# Patient Record
Sex: Male | Born: 1940 | Race: White | Hispanic: Yes | Marital: Married | Smoking: Former smoker
Health system: Southern US, Community
[De-identification: ages and names within clinical notes are randomized; demographics above are authoritative.]

## PROBLEM LIST (undated history)

## (undated) DIAGNOSIS — G4733 Obstructive sleep apnea (adult) (pediatric): Secondary | ICD-10-CM

## (undated) DIAGNOSIS — T300 Burn of unspecified body region, unspecified degree: Secondary | ICD-10-CM

## (undated) DIAGNOSIS — R7611 Nonspecific reaction to tuberculin skin test without active tuberculosis: Secondary | ICD-10-CM

## (undated) DIAGNOSIS — Z942 Lung transplant status: Secondary | ICD-10-CM

## (undated) DIAGNOSIS — I10 Essential (primary) hypertension: Secondary | ICD-10-CM

## (undated) DIAGNOSIS — J84112 Idiopathic pulmonary fibrosis: Secondary | ICD-10-CM

## (undated) HISTORY — DX: Burn of unspecified body region, unspecified degree: T30.0

## (undated) HISTORY — PX: LUNG BIOPSY: SHX232

## (undated) HISTORY — PX: LUNG TRANSPLANT: SHX234

## (undated) HISTORY — DX: Idiopathic pulmonary fibrosis: J84.112

## (undated) HISTORY — DX: Nonspecific reaction to tuberculin skin test without active tuberculosis: R76.11

## (undated) HISTORY — DX: Obstructive sleep apnea (adult) (pediatric): G47.33

---

## 1999-05-27 ENCOUNTER — Encounter: Admission: RE | Admit: 1999-05-27 | Discharge: 1999-05-27 | Payer: Self-pay | Admitting: Internal Medicine

## 1999-05-27 ENCOUNTER — Encounter: Payer: Self-pay | Admitting: Internal Medicine

## 1999-08-02 ENCOUNTER — Ambulatory Visit (HOSPITAL_COMMUNITY): Admission: RE | Admit: 1999-08-02 | Discharge: 1999-08-02 | Payer: Self-pay | Admitting: Orthopedic Surgery

## 1999-08-02 ENCOUNTER — Encounter: Payer: Self-pay | Admitting: Orthopedic Surgery

## 1999-09-10 ENCOUNTER — Encounter: Payer: Self-pay | Admitting: Orthopedic Surgery

## 1999-09-10 ENCOUNTER — Ambulatory Visit (HOSPITAL_COMMUNITY): Admission: RE | Admit: 1999-09-10 | Discharge: 1999-09-10 | Payer: Self-pay | Admitting: Orthopedic Surgery

## 1999-11-22 ENCOUNTER — Ambulatory Visit (HOSPITAL_COMMUNITY): Admission: RE | Admit: 1999-11-22 | Discharge: 1999-11-22 | Payer: Self-pay | Admitting: Orthopedic Surgery

## 1999-11-22 ENCOUNTER — Encounter: Payer: Self-pay | Admitting: Orthopedic Surgery

## 2006-04-03 ENCOUNTER — Encounter: Admission: RE | Admit: 2006-04-03 | Discharge: 2006-04-03 | Payer: Self-pay | Admitting: Internal Medicine

## 2007-10-21 ENCOUNTER — Encounter: Payer: Self-pay | Admitting: Internal Medicine

## 2007-10-29 ENCOUNTER — Encounter: Admission: RE | Admit: 2007-10-29 | Discharge: 2007-10-29 | Payer: Self-pay | Admitting: Internal Medicine

## 2007-12-17 ENCOUNTER — Ambulatory Visit: Payer: Self-pay | Admitting: Internal Medicine

## 2007-12-17 DIAGNOSIS — E119 Type 2 diabetes mellitus without complications: Secondary | ICD-10-CM

## 2007-12-17 DIAGNOSIS — I10 Essential (primary) hypertension: Secondary | ICD-10-CM | POA: Insufficient documentation

## 2007-12-17 DIAGNOSIS — J8410 Pulmonary fibrosis, unspecified: Secondary | ICD-10-CM | POA: Insufficient documentation

## 2007-12-17 DIAGNOSIS — G4730 Sleep apnea, unspecified: Secondary | ICD-10-CM | POA: Insufficient documentation

## 2007-12-24 ENCOUNTER — Telehealth: Payer: Self-pay | Admitting: Internal Medicine

## 2007-12-24 LAB — CONVERTED CEMR LAB: Prothrombin Time: 12.3 s (ref 10.9–13.3)

## 2008-01-25 ENCOUNTER — Telehealth (INDEPENDENT_AMBULATORY_CARE_PROVIDER_SITE_OTHER): Payer: Self-pay | Admitting: *Deleted

## 2008-02-09 ENCOUNTER — Ambulatory Visit: Admission: RE | Admit: 2008-02-09 | Discharge: 2008-02-09 | Payer: Self-pay | Admitting: Internal Medicine

## 2008-02-09 ENCOUNTER — Encounter: Payer: Self-pay | Admitting: Internal Medicine

## 2008-02-09 ENCOUNTER — Ambulatory Visit: Payer: Self-pay | Admitting: Internal Medicine

## 2008-02-24 ENCOUNTER — Encounter: Payer: Self-pay | Admitting: Internal Medicine

## 2008-03-10 DIAGNOSIS — G4733 Obstructive sleep apnea (adult) (pediatric): Secondary | ICD-10-CM

## 2008-03-10 HISTORY — DX: Obstructive sleep apnea (adult) (pediatric): G47.33

## 2008-03-17 ENCOUNTER — Ambulatory Visit: Payer: Self-pay | Admitting: Internal Medicine

## 2008-03-17 DIAGNOSIS — R05 Cough: Secondary | ICD-10-CM | POA: Insufficient documentation

## 2008-03-17 DIAGNOSIS — R0902 Hypoxemia: Secondary | ICD-10-CM | POA: Insufficient documentation

## 2008-03-17 DIAGNOSIS — J82 Pulmonary eosinophilia, not elsewhere classified: Secondary | ICD-10-CM

## 2008-03-17 DIAGNOSIS — J479 Bronchiectasis, uncomplicated: Secondary | ICD-10-CM

## 2008-03-17 DIAGNOSIS — R0982 Postnasal drip: Secondary | ICD-10-CM | POA: Insufficient documentation

## 2008-03-20 ENCOUNTER — Ambulatory Visit: Payer: Self-pay | Admitting: Internal Medicine

## 2008-03-22 ENCOUNTER — Encounter: Payer: Self-pay | Admitting: Internal Medicine

## 2008-03-24 ENCOUNTER — Encounter: Payer: Self-pay | Admitting: Internal Medicine

## 2008-03-29 ENCOUNTER — Ambulatory Visit: Payer: Self-pay | Admitting: Internal Medicine

## 2008-04-04 ENCOUNTER — Telehealth: Payer: Self-pay | Admitting: Internal Medicine

## 2008-04-11 ENCOUNTER — Ambulatory Visit: Payer: Self-pay | Admitting: Thoracic Surgery (Cardiothoracic Vascular Surgery)

## 2008-06-16 ENCOUNTER — Encounter
Admission: RE | Admit: 2008-06-16 | Discharge: 2008-06-16 | Payer: Self-pay | Admitting: Thoracic Surgery (Cardiothoracic Vascular Surgery)

## 2008-06-16 ENCOUNTER — Ambulatory Visit: Payer: Self-pay | Admitting: Thoracic Surgery (Cardiothoracic Vascular Surgery)

## 2008-06-21 ENCOUNTER — Inpatient Hospital Stay (HOSPITAL_COMMUNITY)
Admission: RE | Admit: 2008-06-21 | Discharge: 2008-06-26 | Payer: Self-pay | Admitting: Thoracic Surgery (Cardiothoracic Vascular Surgery)

## 2008-06-21 ENCOUNTER — Encounter: Payer: Self-pay | Admitting: Thoracic Surgery (Cardiothoracic Vascular Surgery)

## 2008-06-21 ENCOUNTER — Ambulatory Visit: Payer: Self-pay | Admitting: Thoracic Surgery (Cardiothoracic Vascular Surgery)

## 2008-06-30 ENCOUNTER — Ambulatory Visit: Payer: Self-pay | Admitting: Thoracic Surgery (Cardiothoracic Vascular Surgery)

## 2008-07-03 ENCOUNTER — Ambulatory Visit: Payer: Self-pay | Admitting: Thoracic Surgery (Cardiothoracic Vascular Surgery)

## 2008-07-03 ENCOUNTER — Encounter
Admission: RE | Admit: 2008-07-03 | Discharge: 2008-07-03 | Payer: Self-pay | Admitting: Thoracic Surgery (Cardiothoracic Vascular Surgery)

## 2008-07-10 ENCOUNTER — Ambulatory Visit: Payer: Self-pay | Admitting: Internal Medicine

## 2008-07-10 DIAGNOSIS — L02219 Cutaneous abscess of trunk, unspecified: Secondary | ICD-10-CM | POA: Insufficient documentation

## 2008-07-10 DIAGNOSIS — E669 Obesity, unspecified: Secondary | ICD-10-CM | POA: Insufficient documentation

## 2008-07-10 DIAGNOSIS — L03319 Cellulitis of trunk, unspecified: Secondary | ICD-10-CM

## 2008-07-18 ENCOUNTER — Telehealth (INDEPENDENT_AMBULATORY_CARE_PROVIDER_SITE_OTHER): Payer: Self-pay | Admitting: *Deleted

## 2008-07-21 ENCOUNTER — Telehealth: Payer: Self-pay | Admitting: Internal Medicine

## 2008-07-24 ENCOUNTER — Encounter: Payer: Self-pay | Admitting: Internal Medicine

## 2008-07-25 ENCOUNTER — Encounter: Admission: RE | Admit: 2008-07-25 | Discharge: 2008-07-25 | Payer: Self-pay | Admitting: Internal Medicine

## 2008-07-31 ENCOUNTER — Encounter: Payer: Self-pay | Admitting: Internal Medicine

## 2008-08-08 ENCOUNTER — Telehealth: Payer: Self-pay | Admitting: Internal Medicine

## 2008-09-05 ENCOUNTER — Encounter: Payer: Self-pay | Admitting: Internal Medicine

## 2008-09-18 ENCOUNTER — Ambulatory Visit: Payer: Self-pay | Admitting: Internal Medicine

## 2008-09-20 ENCOUNTER — Ambulatory Visit (HOSPITAL_BASED_OUTPATIENT_CLINIC_OR_DEPARTMENT_OTHER): Admission: RE | Admit: 2008-09-20 | Discharge: 2008-09-20 | Payer: Self-pay | Admitting: Internal Medicine

## 2008-09-20 ENCOUNTER — Encounter: Payer: Self-pay | Admitting: Pulmonary Disease

## 2008-09-21 ENCOUNTER — Encounter
Admission: RE | Admit: 2008-09-21 | Discharge: 2008-09-21 | Payer: Self-pay | Admitting: Thoracic Surgery (Cardiothoracic Vascular Surgery)

## 2008-09-21 ENCOUNTER — Encounter: Payer: Self-pay | Admitting: Internal Medicine

## 2008-09-21 ENCOUNTER — Ambulatory Visit: Payer: Self-pay | Admitting: Thoracic Surgery (Cardiothoracic Vascular Surgery)

## 2008-09-27 ENCOUNTER — Ambulatory Visit: Payer: Self-pay | Admitting: Pulmonary Disease

## 2008-10-18 ENCOUNTER — Ambulatory Visit: Payer: Self-pay | Admitting: Internal Medicine

## 2008-10-18 DIAGNOSIS — R079 Chest pain, unspecified: Secondary | ICD-10-CM

## 2008-10-20 ENCOUNTER — Ambulatory Visit: Admission: RE | Admit: 2008-10-20 | Discharge: 2008-10-20 | Payer: Self-pay | Admitting: Internal Medicine

## 2008-10-20 ENCOUNTER — Ambulatory Visit: Payer: Self-pay | Admitting: Internal Medicine

## 2008-10-20 ENCOUNTER — Encounter: Payer: Self-pay | Admitting: Internal Medicine

## 2008-10-26 ENCOUNTER — Encounter: Payer: Self-pay | Admitting: Internal Medicine

## 2008-10-31 ENCOUNTER — Telehealth: Payer: Self-pay | Admitting: Internal Medicine

## 2008-11-01 ENCOUNTER — Ambulatory Visit: Payer: Self-pay | Admitting: Internal Medicine

## 2008-11-03 ENCOUNTER — Ambulatory Visit: Payer: Self-pay | Admitting: Pulmonary Disease

## 2008-11-08 ENCOUNTER — Encounter: Payer: Self-pay | Admitting: Pulmonary Disease

## 2008-11-21 ENCOUNTER — Encounter: Payer: Self-pay | Admitting: Pulmonary Disease

## 2008-11-30 ENCOUNTER — Encounter: Payer: Self-pay | Admitting: Pulmonary Disease

## 2008-12-04 ENCOUNTER — Encounter (HOSPITAL_COMMUNITY): Admission: RE | Admit: 2008-12-04 | Discharge: 2009-03-04 | Payer: Self-pay | Admitting: Internal Medicine

## 2008-12-06 ENCOUNTER — Encounter: Payer: Self-pay | Admitting: Pulmonary Disease

## 2008-12-12 ENCOUNTER — Ambulatory Visit: Payer: Self-pay | Admitting: Pulmonary Disease

## 2008-12-26 ENCOUNTER — Telehealth: Payer: Self-pay | Admitting: Internal Medicine

## 2008-12-26 ENCOUNTER — Encounter: Payer: Self-pay | Admitting: Internal Medicine

## 2009-01-22 ENCOUNTER — Ambulatory Visit: Payer: Self-pay | Admitting: Internal Medicine

## 2009-01-29 ENCOUNTER — Telehealth: Payer: Self-pay | Admitting: Internal Medicine

## 2009-02-02 ENCOUNTER — Telehealth: Payer: Self-pay | Admitting: Internal Medicine

## 2009-02-06 ENCOUNTER — Encounter: Payer: Self-pay | Admitting: Internal Medicine

## 2009-02-06 ENCOUNTER — Ambulatory Visit: Payer: Self-pay | Admitting: Internal Medicine

## 2009-02-06 DIAGNOSIS — J209 Acute bronchitis, unspecified: Secondary | ICD-10-CM | POA: Insufficient documentation

## 2009-03-10 ENCOUNTER — Encounter (HOSPITAL_COMMUNITY): Admission: RE | Admit: 2009-03-10 | Discharge: 2009-06-08 | Payer: Self-pay | Admitting: Internal Medicine

## 2009-03-26 ENCOUNTER — Ambulatory Visit: Payer: Self-pay | Admitting: Pulmonary Disease

## 2009-03-30 ENCOUNTER — Telehealth: Payer: Self-pay | Admitting: Internal Medicine

## 2009-04-03 ENCOUNTER — Ambulatory Visit: Payer: Self-pay | Admitting: Internal Medicine

## 2009-05-01 ENCOUNTER — Ambulatory Visit: Payer: Self-pay | Admitting: Internal Medicine

## 2009-05-09 ENCOUNTER — Telehealth: Payer: Self-pay | Admitting: Internal Medicine

## 2009-05-11 ENCOUNTER — Encounter: Payer: Self-pay | Admitting: Internal Medicine

## 2009-05-14 ENCOUNTER — Telehealth (INDEPENDENT_AMBULATORY_CARE_PROVIDER_SITE_OTHER): Payer: Self-pay | Admitting: *Deleted

## 2009-06-01 ENCOUNTER — Inpatient Hospital Stay (HOSPITAL_COMMUNITY): Admission: EM | Admit: 2009-06-01 | Discharge: 2009-06-02 | Payer: Self-pay | Admitting: Emergency Medicine

## 2009-06-22 ENCOUNTER — Encounter: Payer: Self-pay | Admitting: Internal Medicine

## 2009-07-06 ENCOUNTER — Ambulatory Visit: Payer: Self-pay | Admitting: Internal Medicine

## 2009-07-23 ENCOUNTER — Telehealth (INDEPENDENT_AMBULATORY_CARE_PROVIDER_SITE_OTHER): Payer: Self-pay | Admitting: *Deleted

## 2009-07-24 ENCOUNTER — Inpatient Hospital Stay (HOSPITAL_COMMUNITY): Admission: AD | Admit: 2009-07-24 | Discharge: 2009-07-27 | Payer: Self-pay | Admitting: Pulmonary Disease

## 2009-07-24 ENCOUNTER — Ambulatory Visit: Payer: Self-pay | Admitting: Internal Medicine

## 2009-07-25 ENCOUNTER — Telehealth (INDEPENDENT_AMBULATORY_CARE_PROVIDER_SITE_OTHER): Payer: Self-pay | Admitting: *Deleted

## 2009-07-31 ENCOUNTER — Ambulatory Visit: Payer: Self-pay | Admitting: Internal Medicine

## 2009-08-09 ENCOUNTER — Telehealth: Payer: Self-pay | Admitting: Internal Medicine

## 2009-08-16 ENCOUNTER — Encounter: Payer: Self-pay | Admitting: Internal Medicine

## 2009-08-28 ENCOUNTER — Ambulatory Visit: Payer: Self-pay | Admitting: Internal Medicine

## 2009-09-03 ENCOUNTER — Encounter (INDEPENDENT_AMBULATORY_CARE_PROVIDER_SITE_OTHER): Payer: Self-pay | Admitting: *Deleted

## 2009-09-04 ENCOUNTER — Telehealth: Payer: Self-pay | Admitting: Internal Medicine

## 2009-09-04 ENCOUNTER — Ambulatory Visit: Payer: Self-pay | Admitting: Internal Medicine

## 2009-09-04 ENCOUNTER — Encounter (INDEPENDENT_AMBULATORY_CARE_PROVIDER_SITE_OTHER): Payer: Self-pay | Admitting: *Deleted

## 2009-09-12 ENCOUNTER — Ambulatory Visit: Payer: Self-pay | Admitting: Internal Medicine

## 2009-09-14 ENCOUNTER — Encounter: Payer: Self-pay | Admitting: Internal Medicine

## 2009-09-27 ENCOUNTER — Ambulatory Visit: Payer: Self-pay | Admitting: Infectious Diseases

## 2009-10-02 ENCOUNTER — Encounter: Payer: Self-pay | Admitting: Internal Medicine

## 2009-10-10 ENCOUNTER — Encounter (HOSPITAL_COMMUNITY): Admission: RE | Admit: 2009-10-10 | Discharge: 2009-12-07 | Payer: Self-pay | Admitting: Internal Medicine

## 2009-10-11 ENCOUNTER — Telehealth: Payer: Self-pay | Admitting: Infectious Diseases

## 2009-10-11 ENCOUNTER — Encounter: Payer: Self-pay | Admitting: Internal Medicine

## 2009-10-18 ENCOUNTER — Ambulatory Visit: Payer: Self-pay | Admitting: Internal Medicine

## 2009-10-23 ENCOUNTER — Telehealth: Payer: Self-pay | Admitting: Internal Medicine

## 2009-10-29 ENCOUNTER — Encounter: Payer: Self-pay | Admitting: Infectious Diseases

## 2009-10-29 ENCOUNTER — Encounter: Payer: Self-pay | Admitting: Internal Medicine

## 2009-10-31 ENCOUNTER — Ambulatory Visit: Payer: Self-pay | Admitting: Internal Medicine

## 2009-11-08 ENCOUNTER — Encounter: Payer: Self-pay | Admitting: Internal Medicine

## 2009-12-05 ENCOUNTER — Ambulatory Visit: Payer: Self-pay | Admitting: Internal Medicine

## 2009-12-17 ENCOUNTER — Encounter: Payer: Self-pay | Admitting: Internal Medicine

## 2009-12-28 ENCOUNTER — Ambulatory Visit: Payer: Self-pay | Admitting: Internal Medicine

## 2010-03-10 DIAGNOSIS — Z942 Lung transplant status: Secondary | ICD-10-CM

## 2010-03-10 HISTORY — DX: Lung transplant status: Z94.2

## 2010-03-31 ENCOUNTER — Encounter: Payer: Self-pay | Admitting: Internal Medicine

## 2010-04-09 NOTE — Letter (Signed)
Summary: [facility]  [facility]   Imported By: Sherian Rein 01/03/2010 09:34:01  _____________________________________________________________________  External Attachment:    Type:   Image     Comment:   External Document

## 2010-04-09 NOTE — Progress Notes (Signed)
Summary: O2 tank-LMTCB x 1  Phone Note Call from Patient   Caller: Spouse Call For: ramaswamy Summary of Call: pt's spouse shirley Milroy states that pt is being moved from ICU at [facility]. wanted to know if it's ok if she waits another day or so to bring the O2 tank back to our office or "could someone here pick it up for her"? [phone] (pt saw MR yesterday and was admitted to [facility]).  Initial call taken by: Tivis Ringer, CNA,  Jul 25, 2009 3:47 PM  Follow-up for Phone Call        LMOVMTCB Vernie Murders  Jul 25, 2009 3:55 PM  Spoke with pt's spouse and made aware per Bjorn Loser that Cathren Laine is going to [facility] this afternoon and will pick up the tank so that she does not have to worry about bringing it back here. Follow-up by: Vernie Murders,  Jul 25, 2009 4:03 PM

## 2010-04-09 NOTE — Letter (Signed)
Summary: [facility]  [facility]   Imported By: Lester Sunset 11/16/2009 08:40:10  _____________________________________________________________________  External Attachment:    Type:   Image     Comment:   External Document
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
## 2010-04-09 NOTE — Miscellaneous (Signed)
Summary: HIPAA Restrictions  HIPAA Restrictions   Imported By: Florinda Marker 09/27/2009 14:31:14  _____________________________________________________________________  External Attachment:    Type:   Image     Comment:   External Document

## 2010-04-09 NOTE — Progress Notes (Signed)
Summary: letter  Phone Note Call from Patient   Caller: Patient Call For: Rodrigues Urbanek Summary of Call: need letter for airline to carry oxygen and cough med on airplane. need by friday Initial call taken by: Rickard Patience,  May 09, 2009 3:09 PM  Follow-up for Phone Call        will forward to MR's box per Jenn. Boone Master CNA  May 09, 2009 3:25 PM   letter same as letter done on 12-26-08. We took that letter and placed on letter head and MR signed. Copy sent to scan. Pt aware letter ready to pick up.Carron Curie CMA  May 11, 2009 10:32 AM

## 2010-04-09 NOTE — Progress Notes (Signed)
Summary: Colon at [facility] vs. [facility]?  Dr. Juanda Chance, Would you review Mr. Poynter chart and advise if you feel he needs to be done at [facility]?  He's doing this colon in preparation for being put on the lung transplant list.  Thank, you. Alvino Chapel I actually prefer to do his colonoscopy at [facility] because the care is better as well as the sedation monitoring. DB

## 2010-04-09 NOTE — Progress Notes (Signed)
Summary: cough med  Phone Note Call from Patient   Caller: Patient Call For: Deaun Rocha Summary of Call: [facility] [address] and needs to be non narcotic. Initial call taken by: Rickard Patience,  October 23, 2009 11:34 AM  Follow-up for Phone Call        Spoke with pt-at appt had talked about cough med; needs something else that is strong enough but non narcotic-wants of which ever cough syrup is choosen. Please advise.Reynaldo Minium CMA  October 23, 2009 11:42 AM   Additional Follow-up for Phone Call Additional follow up Details #1::        I sent TRamadol already to his [facility] on [address] it is a tablet. That will be the next best thing. I d/w with Dr. Sherene Sires and Dr. Shelle Iron who told me the same thing. I am not sure if he does not want tramadol or wants a syrup or he does not know I sent tramadol  I called him at 12:23 to convey this and left message with above content. He will call back if he is not happy with tramadol and wants something else Additional Follow-up by: Kalman Shan MD,  October 24, 2009 12:27 PM    New/Updated Medications: * NASAL OXYMIZER 6L O2 use for exertion Prescriptions: NASAL OXYMIZER 6L O2 use for exertion  #1 x 999   Entered and Authorized by:   Kalman Shan MD   Signed by:   Kalman Shan MD on 10/24/2009   Method used:   Print then Give to Patient   RxID:   [id number]   Appended Document: cough med Noted by triage. Pt will call if tramadol does not work for him.

## 2010-04-09 NOTE — Progress Notes (Signed)
Summary: [facility]  Phone Note Call from Patient Call back at [facility] Phone [phone]   Caller: Patient Call For: rhonda Summary of Call: has a question re: appt at [facility].  Initial call taken by: Tivis Ringer, CNA,  August 09, 2009 12:02 PM  Follow-up for Phone Call        Called and spoke with Mr. Madara. He wanted to know the address for Dr. Otto Herb. Address is [address] [facility] [address]. Pt was advised of the address. He stated that was all he needed to know. Alfonso Ramus  August 09, 2009 12:21 PM  Appt scheduled with Dr. Otto Herb on Thurs. 08/16/09 at 1:00. Pt to arrive at 12:30. Pt aware of appt dated, time and location. Rhonda Cobb  August 09, 2009 12:21 PM

## 2010-04-09 NOTE — Letter (Signed)
Summary: Diabetic Instructions  [facility]  [address] [place], [place] [address]   Phone: [phone]  Fax: [phone]    Jorge [name] 09-05-1940 MRN: [id number]   _x  _   ORAL DIABETIC MEDICATION INSTRUCTIONS  The day before your procedure:   Take your diabetic pill as you do normally  The day of your procedure:   Do not take your diabetic pill    We will check your blood sugar levels during the admission process and again in Recovery before discharging you home  ________________________________________________________________________  _ x _   INSULIN (LONG ACTING) MEDICATION INSTRUCTIONS (Lantus, NPH, 70/30, Humulin, Novolin-N)   The day before your procedure:   Take  your regular evening dose    The day of your procedure:   Do not take your morning dose
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
## 2010-04-09 NOTE — Letter (Signed)
Summary: Pulmonary[facility]  [facility]   Imported By: Sherian Rein 09/03/2009 11:42:26  _____________________________________________________________________  External Attachment:    Type:   Image     Comment:   External Document

## 2010-04-09 NOTE — Letter (Signed)
Summary: Thoracic Surgery Clinic Note/[facility]  [facility]   Imported By: Sherian Rein 01/03/2010 09:33:02  _____________________________________________________________________  External Attachment:    Type:   Image     Comment:   External Document

## 2010-04-09 NOTE — Miscellaneous (Signed)
Summary: Order/[place]  Order/[place]   Imported By: Lester [place] 11/16/2009 08:37:23  _____________________________________________________________________  External Attachment:    Type:   Image     Comment:   External Document

## 2010-04-09 NOTE — Letter (Signed)
[facility]   Imported By: Lester [place] 04/03/2009 08:20:21  _____________________________________________________________________  External Attachment:    Type:   Image     Comment:   External Document

## 2010-04-09 NOTE — Consult Note (Signed)
Summary: [facility] / [facility]  [facility] / [facility]   Imported By: Lennie Odor 10/15/2009 14:39:52  _____________________________________________________________________  External Attachment:    Type:   Image     Comment:   External Document

## 2010-04-09 NOTE — Letter (Signed)
Summary: FAA/[facility]  [facility]AA[facility]   Imported By: Lester [place] 05/15/2009 09:40:37  _____________________________________________________________________  External Attachment:    Type:   Image     Comment:   External Document
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
## 2010-04-09 NOTE — Miscellaneous (Signed)
Summary: [facility] Previsit/prep  Clinical Lists Changes  Medications: Added new medication of DULCOLAX 5 MG  TBEC (BISACODYL) Day before procedure take 2 at 3pm and 2 at 8pm. - Signed Added new medication of METOCLOPRAMIDE HCL 10 MG  TABS (METOCLOPRAMIDE HCL) As per prep instructions. - Signed Added new medication of MIRALAX   POWD (POLYETHYLENE GLYCOL 3350) As per prep  instructions. - Signed Rx of DULCOLAX 5 MG  TBEC (BISACODYL) Day before procedure take 2 at 3pm and 2 at 8pm.;  #4 x 0;  Signed;  Entered by: Wyona Almas RN;  Authorized by: Hart Carwin MD;  Method used: Electronically to [facility] [address] [place], [place], [place]  [address], Ph: [phone], Fax: [phone] Rx of METOCLOPRAMIDE HCL 10 MG  TABS (METOCLOPRAMIDE HCL) As per prep instructions.;  #2 x 0;  Signed;  Entered by: Wyona Almas RN;  Authorized by: Hart Carwin MD;  Method used: Electronically to [facility] [address], [place], [place], [place]  [address], Ph: [phone], Fax: [phone] Rx of MIRALAX   POWD (POLYETHYLENE GLYCOL 3350) As per prep  instructions.;  #255gm x 0;  Signed;  Entered by: Wyona Almas RN;  Authorized by: Hart Carwin MD;  Method used: Electronically to [facility] [address] [place], [place], [place]  [address], Ph: [phone], Fax: [phone] Observations: Added new observation of ALLERGY REV: Done (09/04/2009 13:08)    Prescriptions: MIRALAX   POWD (POLYETHYLENE GLYCOL 3350) As per prep  instructions.  #255gm x 0   Entered by:   Wyona Almas RN   Authorized by:   Hart Carwin MD   Signed by:   Wyona Almas RN on 09/04/2009   Method used:   Electronically to        [facility] (retail)       [address]       [place], [place]  [address]       Ph: [phone]       Fax: [phone]   RxID:   [id number] METOCLOPRAMIDE HCL 10 MG  TABS  (METOCLOPRAMIDE HCL) As per prep instructions.  #2 x 0   Entered by:   Wyona Almas RN   Authorized by:   Hart Carwin MD   Signed by:   Wyona Almas RN on 09/04/2009   Method used:   Electronically to        [facility] (retail)       [address]       [place], [place]  [address]       Ph: [phone]       Fax: [phone]   RxID:   [id number] DULCOLAX 5 MG  TBEC (BISACODYL) Day before procedure take 2 at 3pm and 2 at 8pm.  #4 x 0   Entered by:   Wyona Almas RN   Authorized by:   Hart Carwin MD   Signed by:   Wyona Almas RN on 09/04/2009   Method used:   Electronically to        [facility] (retail)       [address]       [place], [place]  [address]       Ph: [phone]       Fax: [phone]   RxID:  [id number]  
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
## 2010-04-09 NOTE — Progress Notes (Signed)
Summary: Jury excuse for wife  Phone Note Other Incoming   Caller: Mohd. [place] Summary of Call: Pt brought by a letter to excuse his wife from [employer]. I have placed it in MR look-at box for him to review.  Initial call taken by: Carron Curie CMA,  March 30, 2009 1:14 PM  Follow-up for Phone Call        I am hearing from [place] and [facility] that there is specific form downstairs that needs to be filled by med records transcription and mailed directly to trial. Please check with them. You can add my letter to that Follow-up by: Kalman Shan MD,  April 03, 2009 8:38 AM  Additional Follow-up for Phone Call Additional follow up Details #1::         Per [facility] pt needs to bring by the jury summons and send this down to medical records and they have a form they send to court.  I called and advised the pt to bring by the [employer]. While speaking to the pt he states he is catching a cold. C/o chest congestion and cough. Per MR add pt on to todays schedule. Pt aware to come today. Pt will bring [employer] with him.  Carron Curie CMA  April 03, 2009 10:06 AM     Additional Follow-up for Phone Call Additional follow up Details #2::    Pt Bennie Dallas in Medical records, since the jury summons is not for the patient then all we can do is a letter asking for her to be dismissed from [employer].  Have you completed a letter yet MR? Please advise. Carron Curie CMA  April 03, 2009 12:14 PM   Additional Follow-up for Phone Call Additional follow up Details #3:: Details for Additional Follow-up Action Taken: yeah letter in emr. just print it out Additional Follow-up by: Kalman Shan MD,  April 04, 2009 2:32 PM  letter printed and stamped. pt aware st front ready to pick up. Carron Curie CMA  April 04, 2009 4:23 PM

## 2010-04-09 NOTE — Letter (Signed)
Summary: Patient Notice- Polyp Results  [facility]  [address] [place], [place] [address]   Phone: [phone]  Fax: [phone]        September 14, 2009 MRN: [id number]    BILLAL ROLLO [address] [place], [place]  [address]    Dear Mr. Doe,  I am pleased to inform you that the colon polyp(s) removed during your recent colonoscopy was (were) found to be benign (no cancer detected) upon pathologic examination.The polyps were adenomatous ( precancerous) and hyperplastic ( not precancerous)  I recommend you have a repeat colonoscopy examination in 5_ years to look for recurrent polyps, as having colon polyps increases your risk for having recurrent polyps or even colon cancer in the future.  Should you develop new or worsening symptoms of abdominal pain, bowel habit changes or bleeding from the rectum or bowels, please schedule an evaluation with either your primary care physician or with me.  Additional information/recommendations:  _X_ No further action with gastroenterology is needed at this time. Please      follow-up with your primary care physician for your other healthcare      needs.  __ Please call [phone] to schedule a return visit to review your      situation.  __ Please keep your follow-up visit as already scheduled.  __ Continue treatment plan as outlined the day of your exam.  Please call us if you are having persistent problems or have questions about your condition that have not been fully answered at this time.  Sincerely,  Hart Carwin MD  This letter has been electronically signed by your physician.  Appended Document: Patient Notice- Polyp Results letter mailed.

## 2010-04-09 NOTE — Assessment & Plan Note (Signed)
Summary: new pt PPD   Referring Provider:  Dr. Pearson Grippe Primary Provider:  Dr. Pearson Grippe - PMD, Dr Aubery Lapping - [facility] pulmonary, Dr Lavinia Sharps [facility]  CC:  New patient PPD.  History of Present Illness: 70 yo M with hx of HTN, DM2, hyperliipidemia adm 07-2009 with acute on chronic respiratory failure, idiopathic pulmonary fibrosis.  CT of chest Jul 25, 2009: This is a high resolution cut exam demonstrating diffuse pulmonary fibrosis which was similar but slightly progressed compared to CT scan on October 20, 2009.  There was no super and post airspace disease other than the acute identified process. Had PPD done 1 month ago 2cm. Still has swellling. Had test as he is getting eval at [facility] for lung txp. He also has had his immunizations updated. Had colonoscopy showing multiple polyps. Removed, benign.  no fever or chills, wt steady ( has been trying to loose, has lost 21# over 6 months), cough is steady, DOE+, cough non-productive.  Prev sputum AFB negative Dec 2009, April 2010  Preventive Screening-Counseling & Management  Alcohol-Tobacco     Alcohol drinks/day: 0     Smoking Status: quit     Smoking Cessation Counseling: no     Smoke Cessation Stage: quit     Packs/Day: 0.5     Year Started: 1959     Year Quit: 1999     Pack years: 20     Tobacco Counseling: not indicated; no tobacco use     Passive Smoke Counseling: not indicated; no passive smoke exposure  Caffeine-Diet-Exercise     Caffeine use/day: coffee,tea     Does Patient Exercise: yes     Type of exercise: bike riding  Safety-Violence-Falls     Seat Belt Use: yes   Updated Prior Medication List: GLUCOPHAGE 500 MG TABS (METFORMIN HCL) 2 tab by mouth every morning, and 2 at bedtime ZOCOR 20 MG TABS (SIMVASTATIN) Take 1 tablet by mouth once a day * N-ACETYLCYSTEINE 600MG  Take 1 tablet by mouth three times a day NOVOLOG MIX 70/30 FLEXPEN 70-30 % SUSP (INSULIN ASPART PROT & ASPART) 25 units in the morning,  corrective dose above 200 as directed by Dr. Selena Batten LEVEMIR 100 UNIT/ML SOLN (INSULIN DETEMIR) as directed BISOPROLOL FUMARATE 5 MG TABS (BISOPROLOL FUMARATE) 1 once daily PANTOPRAZOLE SODIUM 40 MG TBEC (PANTOPRAZOLE SODIUM) Take 1 tablet by mouth once a day before meal * OXYGEN wear 3-4L/min at all times IMURAN 50 MG TABS (AZATHIOPRINE) 1/2 tab qam HYDROCODONE-ACETAMINOPHEN 5-325 MG TABS (HYDROCODONE-ACETAMINOPHEN) as needed  Current Allergies (reviewed today): ! * CHICKEN ! ASA Review of Systems       no lymphadenopathy, no known hx of TB exposure; worked with someone from [place] who had couhg (not clear if pt sick); has travedled in "[place]", was in service; [place].   Vital Signs:  Patient profile:   70 year old male Height:      67 inches (170.18 cm) Weight:      201 pounds (91.36 kg) BMI:     31.59 O2 Sat:      91 % on Room air Temp:     98.8 degrees F (37.11 degrees C) oral Pulse rate:   78 / minute BP sitting:   117 / 77  (left arm)  Vitals Entered By: Baxter Hire) (September 27, 2009 2:18 PM)  O2 Flow:  Room air CC: New patient PPD Pain Assessment Patient in pain?  no      Nutritional Status BMI of > 30 = obese Nutritional Status Detail appetite is good per patient  Does patient need assistance? Functional Status Self care Ambulation Normal   Physical Exam  General:  well-developed, well-nourished, and well-hydrated.   Eyes:  pupils equal, pupils round, and pupils reactive to light.   Mouth:  no gingival abnormalities, pharynx pink and moist, and teeth missing.   Neck:  no masses.   Lungs:  normal respiratory effort.  diffuse crackles.  Heart:  normal rate, regular rhythm, and no murmur.   Abdomen:  soft, non-tender, and normal bowel sounds.   Extremities:  no edema Skin:  scar on L forearm from prev PPD.  Cervical Nodes:  no anterior cervical adenopathy and no posterior cervical adenopathy.   Axillary Nodes:  no R  axillary adenopathy and no L axillary adenopathy.     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 His prev AFB sputums have been negative. He is at high risk for progression to active TB (DM2) currently, his lung txp will make him more so. 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 Will see him back in 1-2 months to assess his tolerance of the TB meds. i explained that he would be started on 1 medicine and 1 vitamin for this therapy.   Orders: Consultation Level IV ([address])  Medications Added to Medication List This Visit: 1)  Glucophage 500 Mg Tabs (Metformin hcl) .... 2 tab by mouth every morning, and 2 at bedtime 2)  Zocor 20 Mg Tabs (Simvastatin) .... Take 1 tablet by mouth once a day 3)  Imuran 50 Mg Tabs (Azathioprine) .... 1/2 tab qam 4)  Hydrocodone-acetaminophen 5-325 Mg Tabs (Hydrocodone-acetaminophen) .... As needed

## 2010-04-09 NOTE — Progress Notes (Signed)
Summary: Sent information to Health Dept.  Phone Note Outgoing Call   Summary of Call: Sent information to [facility] Department on this patient.  Initial call taken by: Kathi Simpers [facility]),  October 11, 2009 1:00 PM
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
## 2010-04-09 NOTE — Progress Notes (Signed)
Summary: rx for cough syrup  Phone Note Call from Patient Call back at Home Phone [phone]   Caller: Patient Call For: ramaswamy Reason for Call: Talk to Nurse Summary of Call: refused refill on cough medicine, please call pt re: this. Initial call taken by: Eugene Gavia,  May 14, 2009 3:25 PM  Follow-up for Phone Call        please advise if ok to refill cough medication. Carron Curie CMA  May 14, 2009 3:48 PM   Additional Follow-up for Phone Call Additional follow up Details #1::        he can have. Pulmonary Fibrosis patients have cough due to the fibrosis. Nothing works for it. he can have it. no need to come to office for it. He is a responsible patient Additional Follow-up by: Kalman Shan MD,  May 14, 2009 11:51 PM    Additional Follow-up for Phone Call Additional follow up Details #2::    called and spoke with pt.  pt aware rx sent to pharmacy.  Aundra Millet Reynolds LPN  May 16, 979 8:51 AM   Prescriptions: PROMETHAZINE-CODEINE 6.25-10 MG/5ML SYRP (PROMETHAZINE-CODEINE) 1 teaspoon every 12 hours as needed cough  #300 x 3   Entered by:   Arman Filter LPN   Authorized by:   Kalman Shan MD   Signed by:   Arman Filter LPN on 19/14/7829   Method used:   Telephoned to ...       [facility] (retail)       [address]       [place], [place]  [address]       Ph: [phone]       Fax: [phone]   RxID:   [id number]

## 2010-04-09 NOTE — Miscellaneous (Signed)
Summary: Pulm Progress Report/[facility]  Pulm Progress Report/[facility]   Imported By: Sherian Rein 07/09/2009 14:05:42  _____________________________________________________________________  External Attachment:    Type:   Image     Comment:   External Document

## 2010-04-09 NOTE — Letter (Signed)
Summary: [facility] Instructions  [facility]  [address] [place], [place] [address]   Phone: [phone]  Fax: [phone]       Jorge [name]    07/09/1940    MRN: [id number]       Procedure Day Dorna Bloom:  [facility]  09/12/09     Arrival Time:  8:30AM     Procedure Time:  9:30AM     Location of Procedure:                    Juliann Pares _  [facility] (4th Floor)   PREPARATION FOR COLONOSCOPY WITH MIRALAX  Starting 5 days prior to your procedure 09/07/09 do not eat nuts, seeds, popcorn, corn, beans, peas,  salads, or any raw vegetables.  Do not take any fiber supplements (e.g. Metamucil, Citrucel, and Benefiber). ____________________________________________________________________________________________________   THE DAY BEFORE YOUR PROCEDURE         DATE: 09/11/09 DAY: TUESDAY  1   Drink clear liquids the entire day-NO SOLID FOOD  2   Do not drink anything colored red or purple.  Avoid juices with pulp.  No orange juice.  3   Drink at least 64 oz. (8 glasses) of fluid/clear liquids during the day to prevent dehydration and help the prep work efficiently.  CLEAR LIQUIDS INCLUDE: Water Jello Ice Popsicles Tea (sugar ok, no milk/cream) Powdered fruit flavored drinks Coffee (sugar ok, no milk/cream) Gatorade Juice: apple, white grape, white cranberry  Lemonade Clear bullion, consomm, broth Carbonated beverages (any kind) Strained chicken noodle soup Hard Candy  4   Mix the entire bottle of Miralax with 64 oz. of Gatorade/Powerade in the morning and put in the refrigerator to chill.  5   At 3:00 pm take 2 Dulcolax/Bisacodyl tablets.  6   At 4:30 pm take one Reglan/Metoclopramide tablet.  7  Starting at 5:00 pm drink one 8 oz glass of the Miralax mixture every 15-20 minutes until you have finished drinking the entire 64 oz.  You should finish drinking prep around 7:30 or 8:00 pm.  8   If you are nauseated, you may take the 2nd Reglan/Metoclopramide tablet  at 6:30 pm.        9    At 8:00 pm take 2 more DULCOLAX/Bisacodyl tablets.     THE DAY OF YOUR PROCEDURE      DATE: 09/12/09  DAY: Lulu Riding  You may drink clear liquids until 7:30AM  (2 HOURS BEFORE PROCEDURE).   MEDICATION INSTRUCTIONS  Unless otherwise instructed, you should take regular prescription medications with a small sip of water as early as possible the morning of your procedure.  Additional medication instructions:   See Diabetic instructions on separate sheet.         OTHER INSTRUCTIONS  You will need a responsible adult at least 70 years of age to accompany you and drive you home.   This person must remain in the waiting room during your procedure.  Wear loose fitting clothing that is easily removed.  Leave jewelry and other valuables at home.  However, you may wish to bring a book to read or an iPod/MP3 player to listen to music as you wait for your procedure to start.  Remove all body piercing jewelry and leave at home.  Total time from sign-in until discharge is approximately 2-3 hours.  You should go home directly after your procedure and rest.  You can resume normal activities the day after your procedure.  The day  of your procedure you should not:   Drive   Make legal decisions   Operate machinery   Drink alcohol   Return to work  You will receive specific instructions about eating, activities and medications before you leave.   The above instructions have been reviewed and explained to me by   Wyona Almas RN  September 04, 2009 1:41 PM     I fully understand and can verbalize these instructions _____________________________ Date _______

## 2010-04-09 NOTE — Procedures (Signed)
Summary: Colonoscopy  Patient: Jorge [name] Note: All result statuses are Final unless otherwise noted.  Tests: (1) Colonoscopy (COL)   COL Colonoscopy           DONE     [facility]     [address]     [place], [place]  [address]           COLONOSCOPY PROCEDURE REPORT           PATIENT:  Jorge [name], Jorge [name]  MR#:  [id number]     BIRTHDATE:  [birth year], 69 yrs. old  GENDER:  male     ENDOSCOPIST:  Hedwig Morton. Juanda Chance, MD     REF. BY:  Pearson Grippe, M.D.     PROCEDURE DATE:  09/12/2009     PROCEDURE:  Colonoscopy 09[address]     ASA CLASS:  Class II     INDICATIONS:  Routine Risk Screening     MEDICATIONS:   Versed 8 mg, Fentanyl 50 mcg           DESCRIPTION OF PROCEDURE:   After the risks benefits and     alternatives of the procedure were thoroughly explained, informed     consent was obtained.  Digital rectal exam was performed and     revealed no rectal masses.   The LB [id number] endoscope     was introduced through the anus and advanced to the cecum, which     was identified by both the appendix and ileocecal valve, without     limitations.  The quality of the prep was adequate, using MiraLax.     The instrument was then slowly withdrawn as the colon was fully     examined.     <<PROCEDUREIMAGES>>           FINDINGS:  There were multiple polyps identified and removed. 8     polyps throughout the colon 2-3 mm, all sessile The polyps were     removed using cold biopsy forceps (see image3, image4, image8,     image11, and image13).  This was otherwise a normal examination of     the colon (see image14, image9, image10, image6, and image5).     Retroflexed views in the rectum revealed no abnormalities.    The     scope was then withdrawn from the patient and the procedure     completed.           COMPLICATIONS:  None     ENDOSCOPIC IMPRESSION:     1) Polyps, multiple     2) Otherwise normal examination     RECOMMENDATIONS:     1) Await pathology results     2) High  fiber diet.     REPEAT EXAM:  In 5 year(s) for.           ______________________________     Hedwig Morton. Juanda Chance, MD           CC:           n.     eSIGNED:   Hedwig Morton. Jorge [name] at 09/12/2009 10:28 AM           Vicente Masson, [phone]  Note: An exclamation mark (!) indicates a result that was not dispersed into the flowsheet. Document Creation Date: 09/12/2009 10:29 AM _______________________________________________________________________  (1) Order result status: Final Collection or observation date-time: 09/12/2009 10:21 Requested date-time:  Receipt date-time:  Reported date-time:  Referring Physician:   Ordering Physician: Lina Sar [phone]) Specimen  Source:  Source: Launa Grill Order Number: [id number] Lab site:   Appended Document: Colonoscopy     Procedures Next Due Date:    Colonoscopy: 09/2014
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
## 2010-04-09 NOTE — Progress Notes (Signed)
Summary: congestion/ sob  Phone Note Call from Patient   Caller: Patient Call For: ramaswamy Summary of Call: need antibiotic for congestion and sob low fever [facility] Initial call taken by: Rickard Patience,  Jul 23, 2009 11:17 AM  Follow-up for Phone Call        Pt states he felt better after round of abx and pred given on 07-06-09, but since Friday he has began to have incresed SOB, chest congestion, fever, and productive cough with yellow phlegm. I advised pt he needed to f/u in 3 weeks anyways, so offered appt today with MR. Pt refused statign he felt to bad to come into office today but asked for appt tomorrow. I advised I would send a message to MR because pt may not need to wait until tomorrow to be treated with his history. Pt already had f/u set for Friday with MR.  Please advsie. Carron Curie CMA  Jul 23, 2009 11:27 AM   Additional Follow-up for Phone Call Additional follow up Details #1::        If he is bad enough to come to office, then he is bad endough he needs admission or ER Visit. He has pulm fibrosis. If he wants to try it out at home then it is his choice and it is he taking the risk. IF he wants to do that, please repeat same pred taper and also another abx eg: omnicef 300mg  two times a day x 7 days. Does he have a pulse ox that he is checking at home? Should be above 92% Additional Follow-up by: Kalman Shan MD,  Jul 23, 2009 4:42 PM    Additional Follow-up for Phone Call Additional follow up Details #2::    Spoke with pt and advised the above recs per MR.  Pt states that he is willing to come in for appt with MR tommorrow.  Appt sched for 1:30 pm.  Advised pt to go to ER sooner if worsens.  He states that his o2 has been above 92% with o2 on. Follow-up by: Vernie Murders,  Jul 23, 2009 5:01 PM

## 2010-04-09 NOTE — Miscellaneous (Signed)
Summary: Pulmonary Rehab / [facility]  Pulmonary Rehab / [facility]   Imported By: Lennie Odor 10/16/2009 15:30:09  _____________________________________________________________________  External Attachment:    Type:   Image     Comment:   External Document

## 2010-04-09 NOTE — Miscellaneous (Signed)
Summary: Orders Update pft charges  Clinical Lists Changes  Orders: Added new Service order of Carbon Monoxide diffusing w/capacity (94720) - Signed Added new Service order of Lung Volumes (94240) - Signed Added new Service order of Spirometry (Pre & Post) (94060) - Signed 

## 2010-04-09 NOTE — Letter (Signed)
Summary: [facility]  [address]   [place], [place] [address]   Phone: [phone]  Fax: [phone]    04/03/2009  Trial Court Administrator Attn: Lonn Georgia Request [address] [place], [place] [address]  Re: Jorge [name] Juror Number [id number] Panel Number [id number]  Dear Trial Court Administrator:   Jorge [name]'s husband Mr. Jorge [name] residing at [address] [place], [place]  [address] is my patient and sufferes from pulmonary fibrosis. This is a progressive disease without cure. He is currently disabled, has numerous limitations, and on oxygen. He is very ill from the disease and is at risk of death. His wife is on call 24/7 for his medical needs and is not in a position to be a juror. I strongly recommend and respectfully request she be excused from jury duty so that she can take care of her husband.  If you have any further questions please do not hesistate to contact your office.   Sincerely yours,     Dr. Kalman Shan             Sincerely,   Kalman Shan MD

## 2010-05-26 LAB — GLUCOSE, CAPILLARY: Glucose-Capillary: 145 mg/dL — ABNORMAL HIGH (ref 70–99)

## 2010-05-27 LAB — BASIC METABOLIC PANEL
CO2: 26 mEq/L (ref 19–32)
Calcium: 8.7 mg/dL (ref 8.4–10.5)
Calcium: 8.9 mg/dL (ref 8.4–10.5)
Calcium: 9.3 mg/dL (ref 8.4–10.5)
GFR calc Af Amer: >60 mL/min (ref >60)
GFR calc Af Amer: >60 mL/min (ref >60)
GFR calc Af Amer: >60 mL/min (ref >60)
GFR calc non Af Amer: 59 mL/min — ABNORMAL LOW (ref >60)
GFR calc non Af Amer: >60 mL/min (ref >60)
Glucose, Bld: 255 mg/dL — ABNORMAL HIGH (ref 70–99)
Glucose, Bld: 391 mg/dL — ABNORMAL HIGH (ref 70–99)
Potassium: 5.3 mEq/L — ABNORMAL HIGH (ref 3.5–5.1)
Potassium: 5.5 mEq/L — ABNORMAL HIGH (ref 3.5–5.1)
Potassium: 5.8 mEq/L — ABNORMAL HIGH (ref 3.5–5.1)
Sodium: 132 mEq/L — ABNORMAL LOW (ref 135–145)
Sodium: 133 mEq/L — ABNORMAL LOW (ref 135–145)
Sodium: 134 mEq/L — ABNORMAL LOW (ref 135–145)

## 2010-05-27 LAB — BLOOD GAS, ARTERIAL
Drawn by: 129801
O2 Content: 6 L/min
Patient temperature: 98.6
pH, Arterial: 7.405 (ref 7.350–7.450)

## 2010-05-27 LAB — GLUCOSE, CAPILLARY
Glucose-Capillary: 149 mg/dL — ABNORMAL HIGH (ref 70–99)
Glucose-Capillary: 243 mg/dL — ABNORMAL HIGH (ref 70–99)
Glucose-Capillary: 410 mg/dL — ABNORMAL HIGH (ref 70–99)
Glucose-Capillary: 453 mg/dL — ABNORMAL HIGH (ref 70–99)
Glucose-Capillary: 520 mg/dL — ABNORMAL HIGH (ref 70–99)

## 2010-05-27 LAB — DIFFERENTIAL
Basophils Relative: 0 % (ref 0–1)
Lymphocytes Relative: 11 % — ABNORMAL LOW (ref 12–46)
Lymphs Abs: 1.4 10*3/uL (ref 0.7–4.0)
Monocytes Absolute: 0.6 10*3/uL (ref 0.1–1.0)
Monocytes Relative: 5 % (ref 3–12)
Neutro Abs: 10.5 10*3/uL — ABNORMAL HIGH (ref 1.7–7.7)
Neutrophils Relative %: 83 % — ABNORMAL HIGH (ref 43–77)

## 2010-05-27 LAB — CBC
HCT: 36.2 % — ABNORMAL LOW (ref 39.0–52.0)
HCT: 36.5 % — ABNORMAL LOW (ref 39.0–52.0)
Hemoglobin: 12.2 g/dL — ABNORMAL LOW (ref 13.0–17.0)
Hemoglobin: 12.3 g/dL — ABNORMAL LOW (ref 13.0–17.0)
MCHC: 33.9 g/dL (ref 30.0–36.0)
RBC: 3.96 MIL/uL — ABNORMAL LOW (ref 4.22–5.81)
RBC: 3.98 MIL/uL — ABNORMAL LOW (ref 4.22–5.81)
RDW: 13.4 % (ref 11.5–15.5)
WBC: 12.7 10*3/uL — ABNORMAL HIGH (ref 4.0–10.5)

## 2010-05-27 LAB — PHOSPHORUS: Phosphorus: 2.3 mg/dL (ref 2.3–4.6)

## 2010-05-27 LAB — LACTIC ACID, PLASMA
Lactic Acid, Venous: 1.5 mmol/L (ref 0.5–2.2)
Lactic Acid, Venous: 2.7 mmol/L — ABNORMAL HIGH (ref 0.5–2.2)

## 2010-05-27 LAB — TROPONIN I: Troponin I: 0.02 ng/mL (ref 0.00–0.06)

## 2010-05-27 LAB — PROCALCITONIN: Procalcitonin: <0.5 ng/mL

## 2010-05-27 LAB — MRSA PCR SCREENING: MRSA by PCR: NEGATIVE

## 2010-06-03 LAB — CARDIAC PANEL(CRET KIN+CKTOT+MB+TROPI)
CK, MB: 2.8 ng/mL (ref 0.3–4.0)
Relative Index: INVALID (ref 0.0–2.5)
Relative Index: INVALID (ref 0.0–2.5)
Total CK: 54 U/L (ref 7–232)
Total CK: 54 U/L (ref 7–232)
Total CK: 61 U/L (ref 7–232)

## 2010-06-03 LAB — GLUCOSE, CAPILLARY
Glucose-Capillary: 107 mg/dL — ABNORMAL HIGH (ref 70–99)
Glucose-Capillary: 150 mg/dL — ABNORMAL HIGH (ref 70–99)
Glucose-Capillary: 173 mg/dL — ABNORMAL HIGH (ref 70–99)
Glucose-Capillary: 224 mg/dL — ABNORMAL HIGH (ref 70–99)
Glucose-Capillary: 31 mg/dL — CL (ref 70–99)
Glucose-Capillary: 36 mg/dL — CL (ref 70–99)
Glucose-Capillary: 39 mg/dL — CL (ref 70–99)
Glucose-Capillary: 51 mg/dL — ABNORMAL LOW (ref 70–99)

## 2010-06-03 LAB — URINALYSIS, ROUTINE W REFLEX MICROSCOPIC
Glucose, UA: 250 mg/dL — AB
Ketones, ur: NEGATIVE mg/dL
pH: 5 (ref 5.0–8.0)

## 2010-06-03 LAB — COMPREHENSIVE METABOLIC PANEL
AST: 51 U/L — ABNORMAL HIGH (ref 0–37)
Albumin: 3.8 g/dL (ref 3.5–5.2)
CO2: 20 mEq/L (ref 19–32)
Calcium: 8.7 mg/dL (ref 8.4–10.5)
Creatinine, Ser: 0.86 mg/dL (ref 0.4–1.5)
GFR calc Af Amer: >60 mL/min (ref >60)
GFR calc non Af Amer: >60 mL/min (ref >60)
Total Protein: 8.2 g/dL (ref 6.0–8.3)

## 2010-06-03 LAB — CBC
MCHC: 32.9 g/dL (ref 30.0–36.0)
MCV: 91.7 fL (ref 78.0–100.0)
Platelets: 241 10*3/uL (ref 150–400)
RDW: 15.1 % (ref 11.5–15.5)

## 2010-06-03 LAB — BASIC METABOLIC PANEL
CO2: 23 mEq/L (ref 19–32)
Calcium: 8.4 mg/dL (ref 8.4–10.5)
Chloride: 110 mEq/L (ref 96–112)
GFR calc Af Amer: >60 mL/min (ref >60)
Potassium: 4.9 mEq/L (ref 3.5–5.1)
Sodium: 138 mEq/L (ref 135–145)

## 2010-06-03 LAB — HEMOGLOBIN A1C
Hgb A1c MFr Bld: 7.6 % — ABNORMAL HIGH (ref 4.6–6.1)
Mean Plasma Glucose: 171 mg/dL

## 2010-06-03 LAB — URINE CULTURE
Colony Count: NO GROWTH
Culture: NO GROWTH

## 2010-06-03 LAB — STOOL CULTURE

## 2010-06-03 LAB — LIPID PANEL: Cholesterol: 82 mg/dL (ref 0–200)

## 2010-06-03 LAB — DIFFERENTIAL
Eosinophils Relative: 2 % (ref 0–5)
Lymphocytes Relative: 14 % (ref 12–46)
Lymphs Abs: 1.2 10*3/uL (ref 0.7–4.0)

## 2010-06-03 LAB — OVA AND PARASITE EXAMINATION: Ova and parasites: NONE SEEN

## 2010-06-12 ENCOUNTER — Other Ambulatory Visit: Payer: Self-pay | Admitting: Internal Medicine

## 2010-06-12 LAB — GLUCOSE, CAPILLARY: Glucose-Capillary: 214 mg/dL — ABNORMAL HIGH (ref 70–99)

## 2010-06-13 LAB — GLUCOSE, CAPILLARY
Glucose-Capillary: 120 mg/dL — ABNORMAL HIGH (ref 70–99)
Glucose-Capillary: 160 mg/dL — ABNORMAL HIGH (ref 70–99)
Glucose-Capillary: 228 mg/dL — ABNORMAL HIGH (ref 70–99)

## 2010-06-19 LAB — GLUCOSE, CAPILLARY
Glucose-Capillary: 117 mg/dL — ABNORMAL HIGH (ref 70–99)
Glucose-Capillary: 133 mg/dL — ABNORMAL HIGH (ref 70–99)
Glucose-Capillary: 146 mg/dL — ABNORMAL HIGH (ref 70–99)
Glucose-Capillary: 150 mg/dL — ABNORMAL HIGH (ref 70–99)
Glucose-Capillary: 169 mg/dL — ABNORMAL HIGH (ref 70–99)
Glucose-Capillary: 170 mg/dL — ABNORMAL HIGH (ref 70–99)
Glucose-Capillary: 179 mg/dL — ABNORMAL HIGH (ref 70–99)
Glucose-Capillary: 184 mg/dL — ABNORMAL HIGH (ref 70–99)
Glucose-Capillary: 151 mg/dL — ABNORMAL HIGH (ref 70–99)
Glucose-Capillary: 161 mg/dL — ABNORMAL HIGH (ref 70–99)
Glucose-Capillary: 187 mg/dL — ABNORMAL HIGH (ref 70–99)

## 2010-06-19 LAB — CBC
HCT: 33.2 % — ABNORMAL LOW (ref 39.0–52.0)
Hemoglobin: 10.4 g/dL — ABNORMAL LOW (ref 13.0–17.0)
Hemoglobin: 11.6 g/dL — ABNORMAL LOW (ref 13.0–17.0)
MCHC: 34.4 g/dL (ref 30.0–36.0)
MCHC: 34.9 g/dL (ref 30.0–36.0)
MCV: 90.1 fL (ref 78.0–100.0)
MCV: 90.7 fL (ref 78.0–100.0)
RBC: 4.45 MIL/uL (ref 4.22–5.81)
RDW: 13.2 % (ref 11.5–15.5)
RDW: 13.1 % (ref 11.5–15.5)
RDW: 13.2 % (ref 11.5–15.5)
WBC: 8.6 10*3/uL (ref 4.0–10.5)

## 2010-06-19 LAB — BLOOD GAS, ARTERIAL
Acid-Base Excess: 0.7 mmol/L (ref 0.0–2.0)
Acid-base deficit: 2.1 mmol/L — ABNORMAL HIGH (ref 0.0–2.0)
Bicarbonate: 25.8 mEq/L — ABNORMAL HIGH (ref 20.0–24.0)
Drawn by: 206361
O2 Content: 2 L/min
O2 Saturation: 95.6 %
O2 Saturation: 97.8 %
TCO2: 23.5 mmol/L (ref 0–100)
pCO2 arterial: 39.2 mmHg (ref 35.0–45.0)
pO2, Arterial: 99.9 mmHg (ref 80.0–100.0)

## 2010-06-19 LAB — TYPE AND SCREEN

## 2010-06-19 LAB — TISSUE CULTURE: Culture: NO GROWTH

## 2010-06-19 LAB — COMPREHENSIVE METABOLIC PANEL
ALT: 41 U/L (ref 0–53)
AST: 85 U/L — ABNORMAL HIGH (ref 0–37)
Albumin: 2.8 g/dL — ABNORMAL LOW (ref 3.5–5.2)
Alkaline Phosphatase: 46 U/L (ref 39–117)
CO2: 22 mEq/L (ref 19–32)
Calcium: 10.1 mg/dL (ref 8.4–10.5)
Creatinine, Ser: 1 mg/dL (ref 0.4–1.5)
GFR calc Af Amer: >60 mL/min (ref >60)
GFR calc non Af Amer: >60 mL/min (ref >60)
Glucose, Bld: 134 mg/dL — ABNORMAL HIGH (ref 70–99)
Glucose, Bld: 343 mg/dL — ABNORMAL HIGH (ref 70–99)
Potassium: 4.3 mEq/L (ref 3.5–5.1)
Sodium: 133 mEq/L — ABNORMAL LOW (ref 135–145)
Sodium: 133 mEq/L — ABNORMAL LOW (ref 135–145)
Total Protein: 5.9 g/dL — ABNORMAL LOW (ref 6.0–8.3)
Total Protein: 7.6 g/dL (ref 6.0–8.3)

## 2010-06-19 LAB — PROTIME-INR: Prothrombin Time: 14 seconds (ref 11.6–15.2)

## 2010-06-19 LAB — BASIC METABOLIC PANEL
CO2: 27 mEq/L (ref 19–32)
Glucose, Bld: 143 mg/dL — ABNORMAL HIGH (ref 70–99)
Potassium: 4.8 mEq/L (ref 3.5–5.1)
Sodium: 136 mEq/L (ref 135–145)

## 2010-06-19 LAB — AFB CULTURE WITH SMEAR (NOT AT ARMC): Acid Fast Smear: NONE SEEN

## 2010-06-19 LAB — URINALYSIS, ROUTINE W REFLEX MICROSCOPIC
Ketones, ur: NEGATIVE mg/dL
Leukocytes, UA: NEGATIVE
Nitrite: NEGATIVE
Protein, ur: NEGATIVE mg/dL
Urobilinogen, UA: 0.2 mg/dL (ref 0.0–1.0)

## 2010-06-19 LAB — URINE MICROSCOPIC-ADD ON

## 2010-06-19 LAB — VIRUS CULTURE

## 2010-06-19 LAB — FUNGUS CULTURE W SMEAR: Fungal Smear: NONE SEEN

## 2010-06-19 LAB — APTT: aPTT: 38 seconds — ABNORMAL HIGH (ref 24–37)

## 2010-07-23 NOTE — Assessment & Plan Note (Signed)
OFFICE VISIT   Jorge [name], Jorge [name]  DOB:  1940/12/06                                        June 16, 2008  CHART #:  [id number]   The patient returns today in followup.  We saw him in February 2010  after he was referred by Dr. Marchelle Gearing.  He has significant  bronchiectasis and ground-glass opacities diffusely throughout both  lungs.  He had bronchoscopic biopsy which was unrevealing, although it  did show inflammation.  It was not specific enough for definitive  diagnosis.  He refused to have an upper lobe biopsy at that time, but  now returns saying his shortness of breath is getting worse and he is  coughing up, what he feels are bits of tissue in addition to yellow  phlegm.  He now is ready to proceed with the surgery.  Please refer to  the dictated note from April 11, 2008, for full details.  I had a long  discussion with the patient regarding the indications, risks, benefits,  and alternatives.  He understands this is a diagnostic and not a  therapeutic procedure, and hopefully we will guide therapy.  He does  understand that there are risks of the procedure including but not  limited to death, MI, DVT, PE, bleeding, possible need for transfusions,  infections, prolonged air leak potentially requiring operation, need for  conversion to a thoracotomy from a minimally invasive approach.  He does  understand these risks are relatively low with this type of procedure,  but the risk does not to be consequential.  He wishes to proceed with  surgery and we will plan to proceed on Wednesday, June 21, 2008.  He  will come into the hospital on the day of surgery.   Salvatore Decent Dorris Fetch, M.D.  Electronically Signed   SCH/MEDQ  D:  06/16/2008  T:  06/17/2008  Job:  [id number]   cc:   Kalman Shan, MD  Massie Maroon, MD

## 2010-07-23 NOTE — Consult Note (Signed)
NEW PATIENT CONSULTATION   [name], [name]  DOB:  11-Aug-1940                                        April 11, 2008  CHART #:  [id number]   The patient is a 70 year old gentleman who presents with chief complaint  of cough and shortness of breath with exertion.  He noted the onset of a  cough about 6 months ago.  Since that time, he has become progressively  worse occasionally productive of yellow sputum.  He also has noted  progressively worsening shortness of breath with exertion.  He has been  documented by Dr. Marchelle [name] to desaturate with exertion as well.  He  currently would get short of breath with walking up one flight of stairs  or carrying heavy objects.  He has not had any hemoptysis.  He has a  remote history of tobacco abuse, but quit in 1995.   He had a workup including a chest CT, which shows fibrosis and  bronchiectasis.  His collagen vascular workup was negative.  Sed rate  was 34.  CRP less than 7.  He had hypersensitivity testing for  Aspergillus, which was negative.  There also were no antibodies, the  coccidiosis, Histoplasma, or Blasto.  He underwent bronchoscopy and  biopsy by Dr. Marchelle [name] that revealed inflammation, but no definitive  diagnosis.  Now, he is referred for consideration for possible lung  biopsy.   PAST MEDICAL HISTORY:  Significant for hypertension, hyperlipidemia,  remote tobacco abuse, type 2 diabetes, asbestos exposure.  He denies any  coronary artery disease and gastroesophageal reflux.   CURRENT MEDICATIONS:  Quinine sulfate 260 mg daily, Darvocet 100 p.r.n.,  Glucophage 500 mg b.i.d., simvastatin 10 mg nightly, Actos 45 mg daily,  Diovan 160 mg daily, Amaryl 2 mg daily, and chlorpheniramine 2 tablets  nightly.   ALLERGIES:  To aspirin and chicken.   FAMILY HISTORY:  Father died of liver cancer.  Mother died of an  aneurysm.   SOCIAL HISTORY:  He works as a Teaching laboratory technician for apartment  complex.  He has  been exposed to mold, had been exposed to asbestos  during other occupations.  He does not smoke.  He did previously quit in  1995.  He does not drink.  He lives with his wife.   REVIEW OF SYSTEMS:  The patient medical history form is reviewed and on  the chart.  He notes shortness of breath with exertion, productive  cough, and urinary frequency.  He denies specifically any chest pain or  tightness in association with shortness of breath.  All other systems  are negative.   PHYSICAL EXAMINATION:  GENERAL:  The patient is a well-appearing 67-year-  old male, in no acute distress.  VITAL SIGNS:  Blood pressure 131/79, pulse 104, respirations 18.  His ox  saturation 93% on room air.  NEUROLOGIC:  He is alert and oriented x3 with no focal deficits.  HEENT:  Unremarkable.  NECK:  Supple without thyromegaly or adenopathy.  CARDIAC:  Has regular rate and rhythm.  Normal S1, S2.  No murmurs,  rubs, or gallops.  LUNGS:  Have coarse breath sounds bilaterally.  There are dry crackles.  No wheezes.  ABDOMEN:  Soft, nontender.  EXTREMITIES:  Without clubbing, cyanosis, or edema.  He has brisk  capillary refill.   LABORATORY DATA:  CT scan  is reviewed, it shows bronchiectasis and  ground-glass opacities bilaterally.  There are no dominant masses.  No  adenopathy.   Pulmonary function testing shows an FEV-1 of 2.76, 73% of predicted.  His FVC is 2.2, which is 55% of predicted.  Total lung capacity is 55%  predicted.  Diffusion capacity is 44% predicted.   IMPRESSION:  The patient is a 70 year old gentleman with a history of  tobacco abuse and multiple occupational exposures who presents with  progressive dyspnea over the past 6 months.  A CT scan showed  progression of ground-glass opacities and bronchiectasis bilaterally.  Bronchoscopic biopsy was unrevealing of a definitive diagnosis and the  patient needs lung biopsy for definitive diagnostic purposes.  I have  discussed in detail with  the patient and his wife the need to extend the  operation, need for general anesthesia, incisions to be used, expected  video-assisted thoracoscopy approach with possibility of thoracotomy if  there were to be severe adhesions.  We discussed that this was a  diagnostic and not therapeutic procedure.  He understands the risks  include, but not limited to death, MI, DVT, PE, bleeding, possible need  for transfusions, infections as well as other unpredictable or  unforeseeable complications and prolonged air leakage.  We did talk  about expected hospital stay 3-4 days, then return to work is variable  depending on the patient factors, but could be as soon as 2-3 weeks.  He  has some issues that he needs to resolve at work before undergoing the  procedure, so he cannot do it this week or next week, but we will be  back in contact with our office to schedule once he has gotten these  issues straightened out.  We will look forward to hearing from him and  we will set it up for him as soon as possible.   Jorge [name], M.D.  Electronically Signed   SCH/MEDQ  D:  04/11/2008  T:  04/11/2008  Job:  [id number]   cc:   Jorge Shan, MD  Jorge Maroon, MD

## 2010-07-23 NOTE — Op Note (Signed)
Jorge [name], Jorge [name]                 ACCOUNT NO.:  [id number]   MEDICAL RECORD NO.:  [id number]          PATIENT TYPE:  AMB   LOCATION:  CARD                         FACILITY:  [facility]   PHYSICIAN:  Kalman Shan, MD   DATE OF BIRTH:  [birth year]   DATE OF PROCEDURE:  02/09/2008  DATE OF DISCHARGE:                               OPERATIVE REPORT   PLANNED PROCEDURE:  Bronchoscopy with transbronchial needle aspiration  of the right paratracheal 3 cm lymph node, right middle lobe  bronchoalveolar lavage and right lower lobe transbronchial biopsy.   INDICATIONS:  a. Mediastinal Node - Rt paratracheal node  b.  Pulmoonary Fibrosis. Alveolar Pneumonopathy NOS. Ground Glass  Infiltrate  c. Dyspnea   CONSENT:  Signed informed consent obtained.   PREPROCEDURE EVALUATION:  The history and physical was greater than 10  days old.  I had seen him in the office on December 17, 2007. After that,  he decided to postpone procedure on the cost of this procedure therefore  he decided to wait before having the bronchoscopy scheduled.  Therefore  repeat preprocedure history and physical was done in the procedure  suite.  He is a 70 year old obese 20 pack smoker with prior asbestos  exposure.  He has had pulmonary fibrosis on CT in January of 2008.  This  has progressed.  The last 6-8 months he has had dyspnea on exertion for  6 months, class 2 to class 3.  His CT scan of the chest in September  2009 shows progressive fibrosis with ground glass infiltrate, no  asbestos plaque.  Collagen vascular workup is negative.  He desaturated  to 90% on exertion on December 17, 2007.  Confirmed n.p.o., medicines are  on hold, he is not on aspirin or Plavix.   PHYSICAL ASSESSMENT:  VITAL SIGNS:  Height 5 feet 7 inches, weight 217  pounds, blood pressure 139/70, pulse of 74, respiratory rate of 24.  ASA  class II.  Airway assessment Mallampati class II.  Physical exam was  within normal limits.  He is obese.  No  wheezing, no crackles.   PREPROCEDURE ASSESSMENT:  He was considered acceptable for moderate  sedation and could undergo procedure.   PREPROCEDURE SEDATION PLAN:  Fentanyl, Versed and topical lidocaine,  maximum lidocaine was 594-700 mg.   PROCEDURE IN DETAIL:  The bronchoscope was introduced through the right  naris at 11:14 a.m.  Lidocaine was applied.  The vocal cords and  epiglottis were normal.  There was redundant soft tissue around the  vocal cords and epiglottis.  The airway exam was essentially normal  including the trachea, carina, right main bronchus, right bronchus  intermedius and the subsegment of the right lung.  The left main  bronchus and the bronchus intermedius and the left lung subsegments were  all normal without any endobronchial lesions.   Attention was then turned to the right paratracheal node.  Based on CT  guided anatomy a transbronchial needle aspiration was done in the 1  o'clock to 2 o'clock position between the midline and the 3 o'clock  position and 1.5 to  2 cm above the carina.  Four pieces were taken.  No  bleeding was noted.   Then right middle lobe bronchoalveolar lavage 40 mL x3 total of 120 mL  lavage was done with return of bright pinkish material.  Total return  was at least 50 mL.  Then transbronchial biopsy under fluoroscopy x5 was  done in the right lower lobe lateral segment.  Adequate tissue was  obtained.  No bleeding was noted.  The scope was then withdrawn at 11:38  a.m.  Prior to scope withdrawal fluoroscopy was used to rule out  pneumothorax.   Total Fentanyl use 100 mcg, Versed use 4 mg, lidocaine 290 mg, fluoro  time was 1.6 minutes.   COMPLICATIONS:  None.   POST PROCEDURE PLAN:  1. Await biopsy results.  2. Get chest x-ray to rule out pneumothorax.  3. Standard recovery procedure in the recovery room.  4. Follow up with Dr. Marchelle Gearing in the office.      Kalman Shan, MD  Electronically Signed     MR/MEDQ  D:   02/09/2008  T:  02/09/2008  Job:  [id number]

## 2010-07-23 NOTE — Assessment & Plan Note (Signed)
OFFICE VISIT   ANUP, BRIGHAM  DOB:  [birth year]                                        September 21, 2008  CHART #:  [id number]   The patient returns today in followup.  He was complaining of pain from  his previous VATS incisions.   The patient is a 70 year old gentleman with pulmonary fibrosis.  He had  a right VATS and lung biopsy back in April that showed interstitial lung  disease and pulmonary fibrosis.  He was last seen in the office on July 03, 2008, at which time he was doing well.  He was using pain medication  2-3 times a day but was having sort of typical postoperative pain and  nothing out of the ordinary.  He states that he continued to have pain  sometimes particularly when he tries to lie on his right side, he has  pain that seems to come from his back and come all the way around at the  level of the incisions, comes all the way to the midline anteriorly and  still if he moves certain ways will have pain but primarily the time it  bothers him the worst is at night and he has trouble resting.  He had a  sleep study recently and that was the worst because with the apparatus  he had to lie in one position and he was never able to get comfortable.  He has had a recent exacerbation of his breathing symptoms which is  being treated with antibiotics by Dr. Marchelle Gearing.   PHYSICAL EXAMINATION:  GENERAL:  The patient is a 69 year old gentleman  in no acute distress.  VITAL SIGNS:  Blood pressure is 145/82, pulse 98, respirations are 18,  and his oxygen saturation is 94% on room air.  LUNGS:  Diffuse crackles bilaterally.  SKIN:  His incisions are clean, dry, intact, and well healed with no  sign of infection.   IMPRESSION:  The patient is a 70 year old gentleman, status post VATS  about 3 months ago.  He is still having some incisional pain, it is  really not at all unusual at this point in time.  He has had a recent  exacerbation of his pulmonary  symptoms which may also have played a role  if he has been having more coughing, having to take deeper inspirations,  could have also aggravated things.  I did not find anything on physical  exam that suggests infections or any other problems in terms of healing,  it is just very typical to continued to have some incisional pain with  port incisions in the chest.  I gave him a prescription for Vicodin  5/325 one to two tablets 3 times daily as needed for pain, #40 tablets  with no refills.  I have asked him to start with one at night before he  goes to sleep and then try and take Tylenol or ibuprofen or something  along those lines during the day.  He can use those during the day, but  was cautioned in regards to driving motor vehicles and other things of  that nature when he is using these pills.  I did tell him that if he  continues to have the pain or if pain worsens, that I would be happy to  see him back at any time  for further assistance, but suspect this pain  will slowly resolve over the next 3-6 months.   Salvatore Decent Dorris Fetch, M.D.  Electronically Signed   SCH/MEDQ  D:  09/21/2008  T:  09/22/2008  Job:  [id number]   cc:   Kalman Shan, MD  Massie Maroon, MD

## 2010-07-23 NOTE — Procedures (Signed)
Jorge [name], AMSLER NO.:  [id number]   MEDICAL RECORD NO.:  [id number]          PATIENT TYPE:  OUT   LOCATION:  SLEEP CENTER                 FACILITY:  [facility]   PHYSICIAN:  Coralyn Helling, MD        DATE OF BIRTH:  1940/09/24   DATE OF STUDY:  09/20/2008                            NOCTURNAL POLYSOMNOGRAM   REFERRING PHYSICIAN:  Kalman Shan, MD   INDICATIONS:  Mr. Shipes is a 70 year old male who has a history of  pulmonary fibrosis, diabetes, hypertension and obesity.  He also has  symptoms of sleep disruption and excessive daytime sleepiness.  He is  referred to the Sleep Lab for evaluation of hypersomnia with obstructive  sleep apnea.   Height is 5 feet 6 inches, weight is 208 pounds, BMI is 34, neck size is  17.   MEDICATIONS:  Quinine, Darvocet, Glucophage, simvastatin, Actos, Diovan,  Amaryl, propoxyphene, and n-acetyl-L-cysteine.   EPWORTH SCORE:  15.   SLEEP ARCHITECTURE:  Total recording time was 359 minutes.  Total sleep  time was 270 minutes.  Sleep efficiency was 73%.  Sleep latency was 52  minutes.  REM latency is 124 minutes.  The study was notable for lack of  slow wave sleep and a reduction in the percentage of REM sleep.  The  patient slept predominately in the supine position.   RESPIRATORY DATA:  The average respiratory rate was 18.  Loud snoring  was noted by the technician.  The overall apnea-hypopnea index was 14.2.  There was 1 central apneic event.  The remainder of the events were  obstructive in nature.  The REM apnea-hypopnea index was 51.4.  The non-  REM apnea-hypopnea index was 13.7.  The nonsupine apnea-hypopnea index  is 4.  The supine apnea-hypopnea index was 12.4.   OXYGEN DATA:  The baseline oxygenation was 94%.  The oxygen saturation  nadir was 84%.  The patient spent total of 2.6 minutes with an oxygen  saturation below 90%.   CARDIAC DATA:  The average heart rate was 81 and the rhythm strip showed  normal sinus  rhythm.   MOVEMENT/PARASOMNIA:  The patient had 2 restroom trips and the periodic  limb movement index was 1.8.   IMPRESSION:  This study shows evidence for mild obstructive sleep apnea.   In addition to diet, exercise and weight reduction, additional  therapeutic interventions could include CPAP therapy, oral appliance or  surgical intervention      Coralyn Helling, MD  Diplomat, American Board of Sleep Medicine  Electronically Signed     VS/MEDQ  D:  09/27/2008 07:47:07  T:  09/27/2008 21:41:04  Job:  [id number]

## 2010-07-23 NOTE — Discharge Summary (Signed)
Jorge [name], Jorge [name]                 ACCOUNT NO.:  [id number]   MEDICAL RECORD NO.:  [id number]           PATIENT TYPE:   LOCATION:                                 FACILITY:   PHYSICIAN:  Salvatore Decent. Dorris Fetch, M.D.DATE OF BIRTH:  1941-01-21   DATE OF ADMISSION:  DATE OF DISCHARGE:                               DISCHARGE SUMMARY   FINAL DIAGNOSIS:  Interstitial lung disease with bronchiectasis, final  pathology pending.   SECONDARY DIAGNOSES:  1. Hypertension.  2. Hyperlipidemia.  3. Longstanding tobacco abuse.  4. Type 2 diabetes.  5. Asbestos exposure.   IN-HOSPITAL OPERATIONS AND PROCEDURES:  Right video-assisted  thoracoscopic surgery with lung biopsy.   HISTORY AND PHYSICAL AND HOSPITAL COURSE:  Jorge [name] is a 70 year old  gentleman with history of tobacco abuse and occupational closure, who  presents with progressive dyspnea.  He was first seen in February, but  did not want to schedule lung biopsy at that time.  CT scan showed  bilateral ground-glass opacities and bronchiectasis bilaterally.  The  patient returned to see Dr. Dorris Fetch in April and ready to proceed  with lung biopsy.  Dr. Dorris Fetch discussed with the patient risks and  benefits of undergoing lung biopsy.  The patient acknowledged  understanding and agreed to proceed.  Surgery was scheduled for June 21, 2008.  For details of the patient's past medical history and  physical exam, please see dictated H&P.   The patient was taken to the operating room on June 21, 2008, where he  underwent right video-assisted thoracoscopic surgery with lung biopsy.  The patient tolerated this procedure well and was transferred to the  Step-Down Unit in stable condition.  Postoperatively, the patient was  able to be extubated following surgery.  Post-extubation, he was noted  to be alert and oriented x4.  Neuro intact.  The patient was noted to be  hemodynamically stable.  The patient's postoperative course was  pretty  much unremarkable.  Chest x-ray was done daily.  Chest x-ray was stable  with no pneumothorax.  The patient had minimal drainage from chest tube.  No air leak noted from chest tube.  Chest tube was placed to water seal  on postop day #1 and was discontinued on postop day #2.  Currently,  awaiting followup chest x-ray following removal of chest tube.  The  patient continued using his incentive spirometer postoperatively.  Currently working on weaning him off oxygen to maintain sats greater  than 90% on room air.  The patient remained hemodynamically stable.  He  was up ambulating without difficulty.  Did have some nausea  postoperatively requiring Reglan and decreasing diet to clears.  Currently advancing diet as tolerated.  We will continue to monitor.  All incisions noted to be clean, dry, and intact and healing well.   On postop day #21m the patient was noted to be afebrile.  He was in  normal sinus rhythm.  Blood pressure stable.  He was satting 98% on 2 L.  Blood sugars have been followed as well due to the patient being  diabetic.  He was restarted on p.o. meds and blood sugars stable.  Most  recent lab work showed sodium of 133, potassium 4.3, chloride of 99,  bicarbonate 25, BUN of 14, creatinine 0.92, and glucose 134.  White  blood cell count of 8.6, hemoglobin 10.4, hematocrit 30.6, and platelet  count 169.  This patient was able to be weaned off oxygen and tolerating  diet well.  X-ray was stable.  He was tentatively ready for discharge  home in 24-48 hours.   FOLLOWUP APPOINTMENTS:  Followup appointment has been arranged with Dr.  Dorris Fetch for Jul 17, 2008, at 12:30 p.m.  The patient will need to  obtain PA and lateral chest x-ray 30 minutes prior to this appointment.  He will need to follow up with a nurse for suture removal on June 30, 2008, at 10:30 a.m.   ACTIVITIES:  The patient is instructed no driving until released to do  so.  No heavy lifting over 10  pounds.  He is told to ambulate 3-4 times  per day, progress as tolerated and to continue his breathing exercises.   INCISIONAL CARE:  The patient is told to shower washing his incisions  using soap and water.  He is to contact the office if he develops any  drainage or opening from any of his incision sites.   DIET:  The patient is educated on diet to be low-fat, low-salt well as  diabetic diet.   DISCHARGE MEDICATIONS:  1. Glimepiride 10 mg daily.  2. Actos 30 mg daily.  3. Diovan 160 mg daily.  4. Simvastatin 10 mg at night.  5. Metformin 500 mg 2 tablets b.i.d.  6. Percocet 5/325 one to two tablets q.4-6 hours p.r.n. pain.      Sol Blazing, PA      Salvatore Decent. Dorris Fetch, M.D.  Electronically Signed    KMD/MEDQ  D:  06/23/2008  T:  06/24/2008  Job:  [id number]   cc:   Jorge Shan, MD  Jorge Maroon, MD

## 2010-07-23 NOTE — Assessment & Plan Note (Signed)
OFFICE VISIT   Jorge [name], Jorge [name]  DOB:  1940/04/13                                        July 03, 2008  CHART #:  [id number]   HISTORY:  The patient is a 70 year old gentleman who underwent a right  VATS and lung biopsy on April 13 for interstitial lung disease.  He was  discharged home on the 21st and now returns for a followup visit.  He  still does have some soreness at his incisions and also has some  referred soreness more anteriorly.  He is using the pain medications 2  or 3 times a day.  The oxycodone that we had given him was causing a lot  of sedation.  He has a prescription for hydrocodone but has not been  able to get that filled yet.  From a breathing standpoint, he is at his  baseline.   PHYSICAL EXAMINATION:  GENERAL:  The patient is a well-appearing  gentleman in no acute distress.  VITAL SIGNS:  Blood pressure 150/84, pulse 100, respirations were 80,  his oxygen saturation is 92% on room air.  LUNGS:  Diffuse crackles bilaterally.  His incisions are clean and dry.  There is some seroma formation  posteriorly.  There is no evidence of infection.   Chest x-ray shows diffuse interstitial lung disease.  There is no  pneumothorax or effusion.   IMPRESSION:  The patient is a 70 year old gentleman with interstitial  lung disease.  He had a VATS lung biopsy on April 30th, which showed  usual interstitial pneumonia (UIP).  He is recovering well at this point  in time.  He is just 2 weeks from the surgery.  He still has some  soreness, has not really returned to work at this point, but should be  within the next week or two.  He has an appointment scheduled for next  Monday, at which time we will reassess and see if he is ready to go back  to work or needs another week or so before he returns to work.  I have  advised him to not do any heavy lifting at the present time just from a  pain standpoint.  He did have port-type incision, so I am not  worried  about disrupting the incision or anything of that  nature.  He has also got an appointment with Dr. Marchelle Gearing next week to  discuss the pathology and see if there was any clues as to the etiology  of his disease also.   Jorge [name], M.D.  Electronically Signed   SCH/MEDQ  D:  07/03/2008  T:  07/04/2008  Job:  [id number]   cc:   Kalman Shan, MD  Massie Maroon, MD

## 2010-07-23 NOTE — Op Note (Signed)
Jorge [name], Jorge [name]                 ACCOUNT NO.:  [id number]   MEDICAL RECORD NO.:  [id number]          PATIENT TYPE:  INP   LOCATION:  3310                         FACILITY:  [facility]   PHYSICIAN:  Salvatore Decent. Dorris Fetch, M.D.DATE OF BIRTH:  [birth year]   DATE OF PROCEDURE:  DATE OF DISCHARGE:                               OPERATIVE REPORT   PREOPERATIVE DIAGNOSIS:  Interstitial lung disease with bronchiectasis.   POSTOPERATIVE DIAGNOSIS:  Interstitial lung disease with bronchiectasis.   PROCEDURE:  Right video-assisted thoracoscopy and lung biopsy.   SURGEON:  Salvatore Decent. Dorris Fetch, MD   ASSISTANT:  Doree Fudge, PA   ANESTHESIA:  General.   FINDINGS:  Diffusely nodular disease, inferior lung.  Frozen section  revealed inflammation and fibrosis.   CLINICAL NOTE:  Jorge [name] is a 70 year old gentleman with history of  tobacco abuse and occupational exposure who presents with aggressive  dyspnea.  He was first seen in February, but did not want to schedule a  lung biopsy at that time.  CT scan showed bilateral ground-glass  opacities of bronchiectasis bilaterally.  The patient now is ready to  proceed with lung biopsy.  The indications, risks, benefits and  alternatives were discussed in detail with the patient.  He understood  and accepted the risks and agreed to proceed.   OPERATIVE NOTE:  Jorge [name] was brought to the preop holding area on  June 21, 2008.  There Anesthesia placed an arterial blood pressure  monitoring catheter, a central venous line, and other intravenous  access.  PAS hose were placed for DVT prophylaxis.  Intravenous  antibiotics were administered.   The patient was taken to the operating room, anesthetized and intubated  with a double-lumen endotracheal tube.  He was placed in a left lateral  decubitus position, and the right chest was prepped and draped in usual  sterile fashion.  An incision was made approximately in the seventh  intercostal space,  midaxillary line was carried through the skin and  subcutaneous tissue.  The chest entered bluntly using a hemostat.  Thoracoscope was inserted.  There was still a residual air trapping in  the lung.  Suction was applied and there was good separation.  The  patient's diaphragm was relatively high.  There was a relatively small  chest cavity for the patient's size.  Additional incisions were made  anteriorly and posteriorly in the fifth intercostal space for  instrumentation.  The lung was inspected, it was diffusely nodular and  grayish in appearance.  There was fairly uniform appearance in the long  throughout with no areas that were definitively worse than others.  The  right middle lobe was grasped and using sequential firings of a Endo-GIA  45 mm staple with 4.5 mL staple depth.  A biopsy was taken from the  right middle lobe.  The specimen was removed and then separated, a  portion was sent for bacterial, fungal, viral, and AFB stains and  cultures.  The remainder including a clear area of bronchiectasis was  sent for frozen section and permanent pathology.  Frozen section  subsequently returned with  evidence of inflammation and fibrosis, no  definitive diagnosis could be made, but there was felt to be adequate  specimen to do so a permanent sections and no additional biopsies were  necessary.  Following the frozen section, the port sites were inspected.  There was bleeding from the posterior port site, which was controlled  with electrocautery.  Chest was copiously irrigated with warm saline.  The staple line was inspected for hemostasis.  A 28-French chest tube  was placed through the midaxillary line and incision secured with two #1  silk sutures.  After identified by pathology, that there was adequate  specimen available.  The lung was reinflated.  The scope was removed and  the remaining two incisions were closed with a #1 Vicryl fascial suture  followed by a 3-0 Vicryl  subcuticular suture.  All sponge, needle, and  instrument counts were correct at end of the procedure.  The patient was  extubated in the operating room and taken to the recovery room in good  condition.      Salvatore Decent Dorris Fetch, M.D.  Electronically Signed     SCH/MEDQ  D:  06/21/2008  T:  06/22/2008  Job:  [id number]   cc:   Kalman Shan, MD  Massie Maroon, MD

## 2010-08-05 ENCOUNTER — Other Ambulatory Visit: Payer: Self-pay | Admitting: Internal Medicine

## 2010-08-05 NOTE — Telephone Encounter (Signed)
Jorge [name], Please call him and state we got letter from [facility] transplant team saying if liver bx is normal they will transplant him. Please wish him our very best. Our thoughts and prayers are with him. Please tell him that I did not know about his fund raiser dinner til the very last and I was on call and could not make it. Has he met his $ goal for transplant ?

## 2010-08-24 ENCOUNTER — Ambulatory Visit (INDEPENDENT_AMBULATORY_CARE_PROVIDER_SITE_OTHER): Payer: Self-pay | Admitting: Internal Medicine

## 2010-08-27 ENCOUNTER — Telehealth: Payer: Self-pay | Admitting: Internal Medicine

## 2010-08-27 NOTE — Telephone Encounter (Signed)
Please advise thanks.

## 2010-08-27 NOTE — Telephone Encounter (Signed)
Plesae let them know that I am aware that he had problems with INH and is pending a repeat liver biopsy  Regarding cardiology.  For solo cardiologist where it can be more personalized than a group- Dr Yates Decamp. For group: [place] - Dr. Shirlee Latch, DR Excell Seltzer, Dr Clifton James

## 2010-08-27 NOTE — Telephone Encounter (Signed)
I can talk to the cardiologists as well to get their mom there when they are ready  For PMD: outside [place] - Dr. Selena Batten. Inside [place]: Dr. Santa Genera, Dr. Amil Amen, Dr Loreen Freud - depending on aread they live we can choose someone closer. Please let them know the sites the above docs practice

## 2010-08-27 NOTE — Telephone Encounter (Signed)
Spoke with Jorge [name] and notified of recs per MR. She verbalized understanding and states nothing further needed.

## 2010-09-13 ENCOUNTER — Other Ambulatory Visit: Payer: Self-pay | Admitting: *Deleted

## 2010-09-13 MED ORDER — TRAMADOL HCL 50 MG PO TABS: ORAL_TABLET | ORAL | Status: DC

## 2010-09-19 ENCOUNTER — Telehealth: Payer: Self-pay | Admitting: Internal Medicine

## 2010-09-19 NOTE — Telephone Encounter (Signed)
Called and spoke with pt's wife.  Wife states pt was put on the lung transplant list at [facility] on Tuesday.  Will forward message to MR as an FYI.

## 2010-09-24 NOTE — Telephone Encounter (Signed)
Spoke tp patient and wife. He just got a lung and is on way to [facility]. Wished him best. FYI. Message closed. They will email me updates

## 2010-11-11 ENCOUNTER — Other Ambulatory Visit: Payer: Self-pay

## 2010-12-13 LAB — AFB CULTURE WITH SMEAR (NOT AT ARMC): Acid Fast Smear: NONE SEEN

## 2010-12-13 LAB — LEGIONELLA PROFILE(CULTURE+DFA/SMEAR)

## 2010-12-13 LAB — BODY FLUID CELL COUNT WITH DIFFERENTIAL
Lymphs, Fluid: 2 %
Monocyte-Macrophage-Serous Fluid: 24 % — ABNORMAL LOW (ref 50–90)
Total Nucleated Cell Count, Fluid: 700 cu mm (ref 0–1000)

## 2010-12-13 LAB — FUNGUS CULTURE W SMEAR

## 2010-12-13 LAB — CULTURE, RESPIRATORY W GRAM STAIN

## 2010-12-26 ENCOUNTER — Telehealth: Payer: Self-pay | Admitting: Pulmonary Disease

## 2010-12-26 NOTE — Telephone Encounter (Signed)
Called and spoke with pt's family member, Talbert Forest.  Talbert Forest states pt's ins company is requesting VS send new RX to [facility] stating pt has OSA and needs to be on cpap.  Informed Talbert Forest, pt hasn't seen VS since 03/2009 and will need OV before this can be done.  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  Talbert Forest is wanting to know if VS will send order for new rx now and once pt starts feeling better, will schedule f/u appt with VS sometime in November.  VS, please advise.  Thanks.

## 2010-12-27 NOTE — Telephone Encounter (Signed)
It may be best to have his physicians at [facility] fill new script for CPAP if he is going follow up care there.  It appears he has significant change in his health status since his last visit with me, and I am not sure what his current settings should be for CPAP or if he may need BPAP instead.  He would need ROV before I could fill script.

## 2010-12-27 NOTE — Telephone Encounter (Signed)
lmomtcb x1 

## 2010-12-30 ENCOUNTER — Telehealth: Payer: Self-pay | Admitting: Pulmonary Disease

## 2010-12-30 NOTE — Telephone Encounter (Signed)
Noted  

## 2010-12-30 NOTE — Telephone Encounter (Signed)
I spoke with pt and he is coming in to see VS 01/06/12 at 11:30. Pt states [facility] will not do a new rx for cpap. They advised him Dr. Craige Cotta will have to be the one to do so. I advised pt he will need to see VS before a new rx can be done. Pt verbalized understanding

## 2010-12-30 NOTE — Telephone Encounter (Signed)
[email or web address]  

## 2010-12-30 NOTE — Telephone Encounter (Signed)
I spoke with pt and he states he needs a new rx for cpap. This is a duplicate message. Please see phone note 12/26/10

## 2011-01-06 ENCOUNTER — Ambulatory Visit: Payer: Self-pay | Admitting: Pulmonary Disease

## 2011-01-19 ENCOUNTER — Emergency Department (HOSPITAL_COMMUNITY)
Admission: EM | Admit: 2011-01-19 | Discharge: 2011-01-19 | Disposition: A | Discharge status: Home / Self Care | Payer: Medicare Other | Attending: Emergency Medicine | Admitting: Emergency Medicine

## 2011-01-19 ENCOUNTER — Emergency Department (HOSPITAL_COMMUNITY): Payer: Medicare Other

## 2011-01-19 ENCOUNTER — Other Ambulatory Visit: Payer: Self-pay

## 2011-01-19 ENCOUNTER — Encounter: Payer: Self-pay | Admitting: *Deleted

## 2011-01-19 DIAGNOSIS — Z79899 Other long term (current) drug therapy: Secondary | ICD-10-CM | POA: Insufficient documentation

## 2011-01-19 DIAGNOSIS — R112 Nausea with vomiting, unspecified: Secondary | ICD-10-CM | POA: Insufficient documentation

## 2011-01-19 DIAGNOSIS — R5381 Other malaise: Secondary | ICD-10-CM | POA: Insufficient documentation

## 2011-01-19 DIAGNOSIS — R5383 Other fatigue: Secondary | ICD-10-CM | POA: Insufficient documentation

## 2011-01-19 DIAGNOSIS — R42 Dizziness and giddiness: Secondary | ICD-10-CM | POA: Insufficient documentation

## 2011-01-19 DIAGNOSIS — I10 Essential (primary) hypertension: Secondary | ICD-10-CM | POA: Insufficient documentation

## 2011-01-19 DIAGNOSIS — E119 Type 2 diabetes mellitus without complications: Secondary | ICD-10-CM | POA: Insufficient documentation

## 2011-01-19 DIAGNOSIS — Z794 Long term (current) use of insulin: Secondary | ICD-10-CM | POA: Insufficient documentation

## 2011-01-19 HISTORY — DX: Essential (primary) hypertension: I10

## 2011-01-19 HISTORY — DX: Lung transplant status: Z94.2

## 2011-01-19 LAB — URINALYSIS, MICROSCOPIC ONLY
Bilirubin Urine: NEGATIVE
Glucose, UA: NEGATIVE mg/dL
Hgb urine dipstick: NEGATIVE
Ketones, ur: NEGATIVE mg/dL
Leukocytes, UA: NEGATIVE
Nitrite: NEGATIVE
Protein, ur: NEGATIVE mg/dL
Specific Gravity, Urine: 1.013 (ref 1.005–1.030)
Urobilinogen, UA: 0.2 mg/dL (ref 0.0–1.0)
pH: 8 (ref 5.0–8.0)

## 2011-01-19 LAB — COMPREHENSIVE METABOLIC PANEL
BUN: 28 mg/dL — ABNORMAL HIGH (ref 6–23)
CO2: 26 mEq/L (ref 19–32)
Chloride: 102 mEq/L (ref 96–112)
Creatinine, Ser: 1.34 mg/dL (ref 0.50–1.35)
GFR calc non Af Amer: 52 mL/min — ABNORMAL LOW (ref >90)
Total Bilirubin: 0.1 mg/dL — ABNORMAL LOW (ref 0.3–1.2)

## 2011-01-19 LAB — CBC
HCT: 30.8 % — ABNORMAL LOW (ref 39.0–52.0)
Hemoglobin: 10.4 g/dL — ABNORMAL LOW (ref 13.0–17.0)
MCH: 30.9 pg (ref 26.0–34.0)
MCHC: 33.8 g/dL (ref 30.0–36.0)
MCV: 91.4 fL (ref 78.0–100.0)
Platelets: 195 10*3/uL (ref 150–400)
RBC: 3.37 MIL/uL — ABNORMAL LOW (ref 4.22–5.81)
RDW: 13.4 % (ref 11.5–15.5)
WBC: 5.9 10*3/uL (ref 4.0–10.5)

## 2011-01-19 LAB — GLUCOSE, CAPILLARY: Glucose-Capillary: 83 mg/dL (ref 70–99)

## 2011-01-19 MED ORDER — ONDANSETRON HCL 4 MG/2ML IJ SOLN
INTRAMUSCULAR | Status: AC
  Administered 2011-01-19: 4 mg
  Filled 2011-01-19: qty 2

## 2011-01-19 MED ORDER — ONDANSETRON HCL 4 MG/2ML IJ SOLN
INTRAMUSCULAR | Status: AC
  Filled 2011-01-19: qty 2

## 2011-01-19 MED ORDER — MECLIZINE HCL 25 MG PO TABS: 25.0000 mg | ORAL_TABLET | Freq: Three times a day (TID) | ORAL | Status: AC | PRN

## 2011-01-19 MED ORDER — DEXTROSE 50 % IV SOLN
INTRAVENOUS | Status: AC
  Administered 2011-01-19: 25 g via INTRAVENOUS
  Filled 2011-01-19: qty 50

## 2011-01-19 MED ORDER — MECLIZINE HCL 25 MG PO TABS
25.0000 mg | ORAL_TABLET | Freq: Once | ORAL | Status: AC
  Administered 2011-01-19: 25 mg via ORAL
  Filled 2011-01-19: qty 1

## 2011-01-19 NOTE — ED Notes (Signed)
Ptar called for bp check and then pt started having ALOC.  Pt became pale and diaphoretic.  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  Pt is pale and clammy on arrival .  Pulses present.  Pt was nauseated and vomited at scene.  cbg-118.  Pt was given zofran 4mg  iv en route.  Pt is keeping eyes closed on arrival but is following commands.

## 2011-01-19 NOTE — ED Provider Notes (Signed)
History     CSN: [id number] Arrival date & time: 01/19/2011  7:26 PM   First MD Initiated Contact with Patient 01/19/11 2006      Chief Complaint  Patient presents with  . Weakness  . Emesis    (Consider location/radiation/quality/duration/timing/severity/associated sxs/prior treatment) Patient is a 70 y.o. male presenting with neurologic complaint. The history is provided by the patient and the spouse.  Neurologic Problem The primary symptoms include dizziness, nausea and vomiting. Primary symptoms do not include headaches, focal weakness or fever. The symptoms began 1 to 2 hours ago. The symptoms are unchanged. The neurological symptoms are focal (just dizzy).  He describes the dizziness as a sensation of spinning. The dizziness began today. The dizziness has been unchanged since its onset. Dizziness also occurs with nausea and vomiting. Dizziness does not occur with blurred vision or weakness.  Nausea began today. The nausea is exacerbated by motion.  The vomiting began today. Vomiting occurs 2 to 5 times per day. The emesis contains stomach contents.  Additional symptoms do not include weakness or nystagmus.    Past Medical History  Diagnosis Date  . Diabetes mellitus   . Hypertension   . Lung transplanted     Past Surgical History  Procedure Date  . Lung surgery     No family history on file.  History  Substance Use Topics  . Smoking status: Never Smoker   . Smokeless tobacco: Not on file  . Alcohol Use: No      Review of Systems  Constitutional: Negative for fever.  Eyes: Negative for blurred vision.  Respiratory: Negative for cough and shortness of breath.   Cardiovascular: Negative for chest pain.  Gastrointestinal: Positive for nausea and vomiting. Negative for abdominal pain and diarrhea.  Neurological: Positive for dizziness. Negative for focal weakness, facial asymmetry, weakness and headaches.  All other systems reviewed and are  negative.    Allergies  Aspirin and Other  Home Medications   Current Outpatient Rx  Name Route Sig Dispense Refill  . ACETAMINOPHEN 500 MG PO TABS Oral Take 500 mg by mouth every 4 (four) hours as needed. For pain     . AMLODIPINE BESYLATE 5 MG PO TABS Oral Take 5 mg by mouth daily.      Marland Kitchen CALCIUM 500-125 MG-UNIT PO TABS Oral Take 1 tablet by mouth 2 (two) times daily.      . FUROSEMIDE 20 MG PO TABS Oral Take 20 mg by mouth daily.      . INSULIN ASPART 100 UNIT/ML [place] SOLN Subcutaneous Inject 15 Units into the skin 4 (four) times daily. Take as directed per sliding scale     . ITRACONAZOLE 100 MG PO CAPS Oral Take 100 mg by mouth daily.      Marland Kitchen MAGNESIUM OXIDE 400 MG PO TABS Oral Take 1,200 mg by mouth 3 (three) times daily.      Marland Kitchen METOPROLOL TARTRATE 25 MG PO TABS Oral Take 25 mg by mouth 2 (two) times daily.      Carma Leaven M PLUS PO TABS Oral Take 1 tablet by mouth daily.      Marland Kitchen MYCOPHENOLATE MOFETIL 500 MG PO TABS Oral Take 1,000 mg by mouth 2 (two) times daily.      . NYSTATIN 100000 UNIT/ML MT SUSP Oral Take 5 mLs by mouth 4 (four) times daily.      . OXYCODONE HCL 5 MG PO CAPS Oral Take 5 mg by mouth every 6 (six) hours as needed.  For pain     . PREDNISONE 5 MG PO TABS Oral Take 10 mg by mouth daily.      Marland Kitchen RANITIDINE HCL 150 MG PO CAPS Oral Take 300 mg by mouth 2 (two) times daily.      Marland Kitchen TACROLIMUS 1 MG PO CAPS Oral Take 0.5 mg by mouth at bedtime.        BP 149/64  Pulse 101  Temp(Src) 96.7 F (35.9 C) (Rectal)  Resp 32  SpO2 100%  Physical Exam  Nursing note and vitals reviewed. Constitutional: He is oriented to person, place, and time. He appears well-developed and well-nourished. No distress.  HENT:  Head: Normocephalic and atraumatic.  Eyes: EOM are normal. Pupils are equal, round, and reactive to light.       Initially, pt refusing to open eyes, but able to pry open and see reactive pupils; when improved vertigo, EOMI without evidence of nystagmus   Cardiovascular: Normal rate, regular rhythm and normal heart sounds.   Pulmonary/Chest: Effort normal and breath sounds normal. No respiratory distress.  Abdominal: Soft. He exhibits no distension. There is no tenderness.  Musculoskeletal: Normal range of motion.  Neurological: He is alert and oriented to person, place, and time. No cranial nerve deficit. He exhibits normal muscle tone. Coordination normal.  Skin: Skin is warm and dry.  Psychiatric: He has a normal mood and affect.    ED Course  Procedures (including critical care time)  Ct Head Wo Contrast  01/19/2011  *RADIOLOGY REPORT*  Clinical Data: Ataxia.  Dizziness.  CT HEAD WITHOUT CONTRAST  Technique:  Contiguous axial images were obtained from the base of the skull through the vertex without contrast.  Comparison: None.  Findings: No acute intracranial hemorrhage, infarction, or mass lesion.  Brain parenchyma is normal.  Osseous structures are normal.  IMPRESSION: Normal exam.  Original Report Authenticated By: Gwynn Burly, M.D.    Date: 01/19/2011  Rate: 92  Rhythm: normal sinus rhythm  QRS Axis: normal  Intervals: normal  ST/T Wave abnormalities: normal  Conduction Disutrbances:none  Narrative Interpretation:   Old EKG Reviewed: unchanged  Results for orders placed during the hospital encounter of 01/19/11  GLUCOSE, CAPILLARY      Component Value Range   Glucose-Capillary 83  70 - 99 (mg/dL)  CBC      Component Value Range   WBC 5.9  4.0 - 10.5 (K/uL)   RBC 3.37 (*) 4.22 - 5.81 (MIL/uL)   Hemoglobin 10.4 (*) 13.0 - 17.0 (g/dL)   HCT 16.1 (*) 09.6 - 52.0 (%)   MCV 91.4  78.0 - 100.0 (fL)   MCH 30.9  26.0 - 34.0 (pg)   MCHC 33.8  30.0 - 36.0 (g/dL)   RDW 04.5  40.9 - 81.1 (%)   Platelets 195  150 - 400 (K/uL)  COMPREHENSIVE METABOLIC PANEL      Component Value Range   Sodium 138  135 - 145 (mEq/L)   Potassium 4.6  3.5 - 5.1 (mEq/L)   Chloride 102  96 - 112 (mEq/L)   CO2 26  19 - 32 (mEq/L)   Glucose,  Bld 43 (*) 70 - 99 (mg/dL)   BUN 28 (*) 6 - 23 (mg/dL)   Creatinine, Ser 9.14  0.50 - 1.35 (mg/dL)   Calcium 9.8  8.4 - 78.2 (mg/dL)   Total Protein 7.6  6.0 - 8.3 (g/dL)   Albumin 3.7  3.5 - 5.2 (g/dL)   AST 35  0 - 37 (U/L)  ALT 26  0 - 53 (U/L)   Alkaline Phosphatase 70  39 - 117 (U/L)   Total Bilirubin 0.1 (*) 0.3 - 1.2 (mg/dL)   GFR calc non Af Amer 52 (*) >90 (mL/min)   GFR calc Af Amer 60 (*) >90 (mL/min)  URINALYSIS, MICROSCOPIC ONLY      Component Value Range   Color, Urine YELLOW  YELLOW    Appearance CLEAR  CLEAR    Specific Gravity, Urine 1.013  1.005 - 1.030    pH 8.0  5.0 - 8.0    Glucose, UA NEGATIVE  NEGATIVE (mg/dL)   Hgb urine dipstick NEGATIVE  NEGATIVE    Bilirubin Urine NEGATIVE  NEGATIVE    Ketones, ur NEGATIVE  NEGATIVE (mg/dL)   Protein, ur NEGATIVE  NEGATIVE (mg/dL)   Urobilinogen, UA 0.2  0.0 - 1.0 (mg/dL)   Nitrite NEGATIVE  NEGATIVE    Leukocytes, UA NEGATIVE  NEGATIVE    Squamous Epithelial / LPF RARE  RARE       1. Vertigo       MDM  8:24 PM Pt with abrupt onset of dizziness about 1-2 hours PTA. Pt is lung transplant patient. Concern because of abrupt onset of dizziness that this could be a CVA. Will get head CT. Will also check labs. Pt does mention some right ear fullness, so possibly related to that if head CT is negative. Will also give meclizine for symptomatic relief.  Ct head unremarkable. Pt noted to have low CBG on CMP. Pt got D50 and was given something to drink as well. Will recheck prior to discharge.  10:17 PM Patient's wife has requested that we speak to the [facility] lung transplant team prior to issuing any further medications. He is feeling much better after the medicine and is now able to open his eyes. EOMI.  10:34 PM I spoke to Dr. Rubye Oaks from [facility] Transplant team. He states that the meclizine would not interact with his medicines. He would advise the patient to follow up this week if the symptoms continue.  10:45 PM Pt CBG  now 186. Will d/c home with family.  Daleen Bo 01/19/11 2358

## 2011-01-20 NOTE — ED Provider Notes (Signed)
I saw and evaluated the patient, reviewed the resident's note and I agree with the findings and plan.  Patient arrived with symptoms consistent with vertigo.  He feels better with meclizine.  Labs, CT okay.  Dr. Aubery Lapping spoke with transplant doctor on-call at [facility], who is okay with our treatment plan.  Will discharge to home with meclizine.  Geoffery Lyons, MD 01/20/11 0005

## 2011-02-06 ENCOUNTER — Encounter: Payer: Self-pay | Admitting: Pulmonary Disease

## 2011-02-06 ENCOUNTER — Ambulatory Visit (INDEPENDENT_AMBULATORY_CARE_PROVIDER_SITE_OTHER): Payer: Medicare Other | Admitting: Pulmonary Disease

## 2011-02-06 VITALS — BP 132/68 | HR 78 | Temp 98.2°F | Ht 67.0 in | Wt 194.6 lb

## 2011-02-06 DIAGNOSIS — G4730 Sleep apnea, unspecified: Secondary | ICD-10-CM

## 2011-02-06 DIAGNOSIS — J8410 Pulmonary fibrosis, unspecified: Secondary | ICD-10-CM

## 2011-02-06 NOTE — Patient Instructions (Signed)
Will arrange for new CPAP supplies Will get copy of CPAP report and call with results Follow up in 6 months
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
## 2011-02-06 NOTE — Assessment & Plan Note (Signed)
Will arrange for new CPAP supplies.  Will get copy of his CPAP download, and then decide if he needs to adjust his CPAP settings.

## 2011-02-10 ENCOUNTER — Encounter: Payer: Self-pay | Admitting: Pulmonary Disease

## 2011-02-10 NOTE — Assessment & Plan Note (Signed)
He had successful lung transplant in January 2011.  He is followed at [facility], but will likely also need to re-establish f/u with Dr. Marchelle Gearing in the near future.  He expressed his appreciation for Dr. Jane Canary assistance in arranging for his lung transplant.

## 2011-02-18 ENCOUNTER — Telehealth: Payer: Self-pay | Admitting: Pulmonary Disease

## 2011-02-18 ENCOUNTER — Encounter: Payer: Self-pay | Admitting: Pulmonary Disease

## 2011-02-18 NOTE — Telephone Encounter (Signed)
CPAP 08/12/10 to 02/09/11>>Used on 168 of 182 nights with average 4 hrs 58 min.  Average AHI 6 with CPAP 13 cm H2O.  Will have my nurse inform pt that CPAP report looked good.  No change to current settings needed.

## 2011-02-19 NOTE — Telephone Encounter (Signed)
I spoke with patient about results and he verbalized understanding and had no questions 

## 2011-04-02 ENCOUNTER — Emergency Department (HOSPITAL_COMMUNITY)
Admission: EM | Admit: 2011-04-02 | Discharge: 2011-04-02 | Disposition: A | Discharge status: Home / Self Care | Payer: Medicare Other | Attending: Emergency Medicine | Admitting: Emergency Medicine

## 2011-04-02 ENCOUNTER — Other Ambulatory Visit: Payer: Self-pay

## 2011-04-02 ENCOUNTER — Encounter (HOSPITAL_COMMUNITY): Payer: Self-pay | Admitting: *Deleted

## 2011-04-02 DIAGNOSIS — E875 Hyperkalemia: Secondary | ICD-10-CM | POA: Insufficient documentation

## 2011-04-02 DIAGNOSIS — I10 Essential (primary) hypertension: Secondary | ICD-10-CM | POA: Insufficient documentation

## 2011-04-02 DIAGNOSIS — G4733 Obstructive sleep apnea (adult) (pediatric): Secondary | ICD-10-CM | POA: Insufficient documentation

## 2011-04-02 DIAGNOSIS — Z79899 Other long term (current) drug therapy: Secondary | ICD-10-CM | POA: Insufficient documentation

## 2011-04-02 DIAGNOSIS — N289 Disorder of kidney and ureter, unspecified: Secondary | ICD-10-CM | POA: Insufficient documentation

## 2011-04-02 DIAGNOSIS — Z794 Long term (current) use of insulin: Secondary | ICD-10-CM | POA: Insufficient documentation

## 2011-04-02 DIAGNOSIS — E119 Type 2 diabetes mellitus without complications: Secondary | ICD-10-CM | POA: Insufficient documentation

## 2011-04-02 LAB — DIFFERENTIAL
Basophils Absolute: 0 10*3/uL (ref 0.0–0.1)
Lymphocytes Relative: 20 % (ref 12–46)
Neutro Abs: 6.9 10*3/uL (ref 1.7–7.7)
Neutrophils Relative %: 75 % (ref 43–77)

## 2011-04-02 LAB — MAGNESIUM: Magnesium: 1.9 mg/dL (ref 1.5–2.5)

## 2011-04-02 LAB — COMPREHENSIVE METABOLIC PANEL
Albumin: 3.8 g/dL (ref 3.5–5.2)
BUN: 30 mg/dL — ABNORMAL HIGH (ref 6–23)
Chloride: 101 mEq/L (ref 96–112)
Creatinine, Ser: 1.76 mg/dL — ABNORMAL HIGH (ref 0.50–1.35)
GFR calc Af Amer: 43 mL/min — ABNORMAL LOW (ref >90)
GFR calc non Af Amer: 37 mL/min — ABNORMAL LOW (ref >90)
Glucose, Bld: 205 mg/dL — ABNORMAL HIGH (ref 70–99)
Total Bilirubin: 0.3 mg/dL (ref 0.3–1.2)

## 2011-04-02 LAB — CBC
HCT: 33.4 % — ABNORMAL LOW (ref 39.0–52.0)
Platelets: 239 10*3/uL (ref 150–400)
RDW: 13.4 % (ref 11.5–15.5)
WBC: 9.2 10*3/uL (ref 4.0–10.5)

## 2011-04-02 MED ORDER — SODIUM POLYSTYRENE SULFONATE 15 GM/60ML PO SUSP
30.0000 g | Freq: Once | ORAL | Status: AC
  Administered 2011-04-02: 30 g via ORAL
  Filled 2011-04-02: qty 120

## 2011-04-02 NOTE — ED Notes (Signed)
Patient's transplant doctor at [facility] is Dr. Izola Price and he would like to be advised once lab work is completed here at [facility]. Phone number for patient's coordinator [facility] is [phone].

## 2011-04-02 NOTE — ED Notes (Signed)
Patient states he had been to [facility] to have lab work today and when he returned to [place] he received a call from his Coordinator at [facility] and they told him to come to the ED here for re-evaluation of 6.3 potassium level. Patient placed on the monitor and oxygen saturation is 97% on RA. Patient denies Chest pain or any general pain or weakness. Patient states he feels fine.

## 2011-04-02 NOTE — ED Notes (Signed)
Patient had been to [facility] to day to have labs drawn post left lung transplant (September 25, 2010). Patient received a call from [facility] advising him to come to the ED for critical high potassium levels. Patient lives here in [place].

## 2011-04-02 NOTE — ED Notes (Signed)
Patient resting with NAD at this time. Patient remains on monitor and oxygen saturation 96%. Patient's wife at bedside.

## 2011-04-02 NOTE — ED Provider Notes (Signed)
6:00 PM  Date: 04/02/2011  Rate: 80  Rhythm: normal sinus rhythm  QRS Axis: normal  Intervals: normal QRS:  Q waves in inferior leads suggests possible old inferior myocardial infarction.  ST/T Wave abnormalities: normal  Conduction Disutrbances:none  Narrative Interpretation: Abnormal EKG  Old EKG Reviewed: unchanged    Carleene Cooper III, MD 04/02/11 [phone]
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
## 2011-04-08 ENCOUNTER — Encounter: Payer: Self-pay | Admitting: Internal Medicine

## 2011-04-08 ENCOUNTER — Telehealth: Payer: Self-pay | Admitting: *Deleted

## 2011-04-08 ENCOUNTER — Ambulatory Visit (INDEPENDENT_AMBULATORY_CARE_PROVIDER_SITE_OTHER): Payer: Medicare Other | Admitting: Internal Medicine

## 2011-04-08 VITALS — BP 130/68 | HR 100 | Temp 97.0°F | Ht 67.0 in | Wt 194.6 lb

## 2011-04-08 DIAGNOSIS — E875 Hyperkalemia: Secondary | ICD-10-CM | POA: Insufficient documentation

## 2011-04-08 NOTE — Progress Notes (Signed)
Subjective:    Patient ID: Jorge [name], male    DOB: 1940-10-08, 71 y.o.   MRN: [id number]  HPI S/p left single lung transplant for IPF at [name]. Followed at [name] since transplant summer 2012  OV 04/08/2011 Not seen him in atleast a year and definitely since transplant. Currently on tacrolimus, cellcept, prednisone, vfend, bactrim proph and dapsone prophylaxis. OVeral well. Says that since transplant having constant issues with hyperkalemia. Some episodes life threatening with K 7-9 with associated bradycardia and needing admits. Till now been on kayexalate only on saturdays but this week changed to daily and now advised to take lasix along with advise to drink lot of fluids to reduce K.  Says that there is lab variation between K levels in [name] an [name]. Says that checks locally have alowasy been below 5.5 but checks at [name] always > 6 including ones on same day. Was in ER 04/02/11 and K was 5.5 but 6.3 at [name] earlier. Says he has been told this is because of vfend or tacrolimus and kayexalate daily is only options. He is facing $ issues and is in distress due to this and frequent checks    IN terms of respiration: stable.   Lab 04/02/11 1718  K 5.5*   No results found for this basename: K:10 in the last 72 hours]  I am not sure why he is comeing to see me for hyperkalemia but I suspect it just out of sheer  Frustration with tthe situation and I happen to be his pulmonary doc who referred him to transplant. Note: post visit, I spoke to [name] post transplant coordinatior who understood his problem and confirmed he should not be on 2 PCP drugs and has been told to stop but has not. In addition, she said that given the fact he is fresh post transplant thehy cannot lowre tacrolimus levels   Review of Systems  Constitutional: Negative for fever and unexpected weight change.  HENT: Negative for ear pain, nosebleeds, congestion, sore throat, rhinorrhea, sneezing, trouble swallowing, dental problem,  postnasal drip and sinus pressure.   Eyes: Negative for redness and itching.  Respiratory: Negative for cough, chest tightness, shortness of breath and wheezing.   Cardiovascular: Negative for palpitations and leg swelling.  Gastrointestinal: Negative for nausea and vomiting.  Genitourinary: Negative for dysuria.  Musculoskeletal: Negative for joint swelling.  Skin: Negative for rash.  Neurological: Negative for headaches.  Hematological: Does not bruise/bleed easily.  Psychiatric/Behavioral: Negative for dysphoric mood. The patient is not nervous/anxious.        Objective:   Physical Exam  Nursing note and vitals reviewed. Constitutional: He is oriented to person, place, and time. He appears well-developed and well-nourished. No distress.       Body mass index is 30.48 kg/(m^2).   HENT:  Head: Normocephalic and atraumatic.  Right Ear: External ear normal.  Left Ear: External ear normal.  Mouth/Throat: Oropharynx is clear and moist. No oropharyngeal exudate.  Eyes: Conjunctivae and EOM are normal. Pupils are equal, round, and reactive to light. Right eye exhibits no discharge. Left eye exhibits no discharge. No scleral icterus.  Neck: Normal range of motion. Neck supple. No JVD present. No tracheal deviation present. No thyromegaly present.  Cardiovascular: Normal rate, regular rhythm and intact distal pulses.  Exam reveals no gallop and no friction rub.   No murmur heard. Pulmonary/Chest: Effort normal. No respiratory distress. He has no wheezes. He has rales. He exhibits no tenderness.  Rales in right lung Clear left lung  Surgical scar in left chest +  Abdominal: Soft. Bowel sounds are normal. He exhibits no distension and no mass. There is no tenderness. There is no rebound and no guarding.  Musculoskeletal: Normal range of motion. He exhibits no edema and no tenderness.  Lymphadenopathy:    He has no cervical adenopathy.  Neurological: He is alert and oriented to  person, place, and time. He has normal reflexes. No cranial nerve deficit. Coordination normal.  Skin: Skin is warm and dry. No rash noted. He is not diaphoretic. No erythema. No pallor.  Psychiatric: He has a normal mood and affect. His behavior is normal. Judgment and thought content normal.          Assessment & Plan:

## 2011-04-08 NOTE — Telephone Encounter (Signed)
Per MR call the pt and get the contact number to his transplant coordinator. The pt states he will have to call me back with this information. Also MR wanted to ask the pt if anyone has discussed him stopping either the dapsone or bactrim and he states no. Per MR this may be causing his potassium issues. I will await a call-back. Carron Curie, CMA

## 2011-04-08 NOTE — Patient Instructions (Signed)
I do not know why your potassium goes up I will touch base with [facility] transplant team and get back to you   - some thoughts are - is there another process beyond transplant drugs ?, does transport to [facility] do anything with lab variations Plesae give me 1-2 weeks to get some answers Return to see me in 3 months otherwise for routine followup Come sooner if there are problems

## 2011-04-08 NOTE — Telephone Encounter (Signed)
Per MR i left message for kimberly to call him on his cell # to discuss the pt. Jorge [name], CMA

## 2011-04-08 NOTE — Telephone Encounter (Signed)
Contact # is [phone], contact person is Cala Bradford. Carron Curie, CMA

## 2011-04-08 NOTE — Assessment & Plan Note (Addendum)
Tacrolimus is likely cause. V fend and bactrim likely contributing. He is on lasix and driking water and on kayexalate to amelirorate this. Noted to be on bactrim and dapsone simultaneously  for PCP prophylaxis. BAcrtim apparently can cause hyperkalemia in setting of hyperkalemia and ? 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 Certainly this issue is out of my area of competence. I have advised him to talk to [facility] transplant team but also consider local renal consult eval. I wil try talking to [facility] transplant as well   I spoke to [facility] and they confirmed that he should not be on bactrim. I called him and advised him so > 50% of this > 15 min visit spent in face to face counseling

## 2011-04-28 ENCOUNTER — Encounter: Payer: Self-pay | Admitting: Internal Medicine

## 2011-07-08 ENCOUNTER — Ambulatory Visit: Payer: Medicare Other | Admitting: Internal Medicine

## 2011-07-18 ENCOUNTER — Ambulatory Visit: Payer: Medicare Other | Admitting: Internal Medicine

## 2013-01-06 ENCOUNTER — Other Ambulatory Visit: Payer: Self-pay | Admitting: Internal Medicine

## 2013-01-06 DIAGNOSIS — N644 Mastodynia: Secondary | ICD-10-CM

## 2013-01-21 ENCOUNTER — Ambulatory Visit
Admission: RE | Admit: 2013-01-21 | Discharge: 2013-01-21 | Disposition: A | Discharge status: Home / Self Care | Payer: Medicare Other | Source: Ambulatory Visit | Attending: Internal Medicine | Admitting: Internal Medicine

## 2013-01-21 DIAGNOSIS — N644 Mastodynia: Secondary | ICD-10-CM

## 2014-04-03 DIAGNOSIS — E118 Type 2 diabetes mellitus with unspecified complications: Secondary | ICD-10-CM | POA: Diagnosis not present

## 2014-04-03 DIAGNOSIS — I10 Essential (primary) hypertension: Secondary | ICD-10-CM | POA: Diagnosis not present

## 2014-04-17 DIAGNOSIS — I10 Essential (primary) hypertension: Secondary | ICD-10-CM | POA: Diagnosis not present

## 2014-04-17 DIAGNOSIS — E875 Hyperkalemia: Secondary | ICD-10-CM | POA: Diagnosis not present

## 2014-04-17 DIAGNOSIS — E118 Type 2 diabetes mellitus with unspecified complications: Secondary | ICD-10-CM | POA: Diagnosis not present

## 2014-04-17 DIAGNOSIS — E780 Pure hypercholesterolemia: Secondary | ICD-10-CM | POA: Diagnosis not present

## 2014-04-20 ENCOUNTER — Other Ambulatory Visit: Payer: Self-pay | Admitting: Internal Medicine

## 2014-04-20 DIAGNOSIS — N644 Mastodynia: Secondary | ICD-10-CM

## 2014-04-20 DIAGNOSIS — N62 Hypertrophy of breast: Secondary | ICD-10-CM

## 2014-04-26 ENCOUNTER — Other Ambulatory Visit: Payer: Self-pay

## 2014-04-27 ENCOUNTER — Ambulatory Visit
Admission: RE | Admit: 2014-04-27 | Discharge: 2014-04-27 | Disposition: A | Discharge status: Home / Self Care | Payer: Medicare Other | Source: Ambulatory Visit | Attending: Internal Medicine | Admitting: Internal Medicine

## 2014-04-27 DIAGNOSIS — N62 Hypertrophy of breast: Secondary | ICD-10-CM

## 2014-04-27 DIAGNOSIS — N644 Mastodynia: Secondary | ICD-10-CM

## 2014-04-27 DIAGNOSIS — N6489 Other specified disorders of breast: Secondary | ICD-10-CM | POA: Diagnosis not present

## 2014-05-23 DIAGNOSIS — E11329 Type 2 diabetes mellitus with mild nonproliferative diabetic retinopathy without macular edema: Secondary | ICD-10-CM | POA: Diagnosis not present

## 2014-05-23 DIAGNOSIS — H524 Presbyopia: Secondary | ICD-10-CM | POA: Diagnosis not present

## 2014-05-23 DIAGNOSIS — H3531 Nonexudative age-related macular degeneration: Secondary | ICD-10-CM | POA: Diagnosis not present

## 2014-07-10 DIAGNOSIS — E119 Type 2 diabetes mellitus without complications: Secondary | ICD-10-CM | POA: Diagnosis not present

## 2014-07-10 DIAGNOSIS — I10 Essential (primary) hypertension: Secondary | ICD-10-CM | POA: Diagnosis not present

## 2014-07-12 DIAGNOSIS — T86819 Unspecified complication of lung transplant: Secondary | ICD-10-CM | POA: Diagnosis not present

## 2014-07-12 DIAGNOSIS — E119 Type 2 diabetes mellitus without complications: Secondary | ICD-10-CM | POA: Diagnosis not present

## 2014-07-12 DIAGNOSIS — I4891 Unspecified atrial fibrillation: Secondary | ICD-10-CM | POA: Diagnosis not present

## 2014-07-12 DIAGNOSIS — Z942 Lung transplant status: Secondary | ICD-10-CM | POA: Diagnosis not present

## 2014-07-12 DIAGNOSIS — I10 Essential (primary) hypertension: Secondary | ICD-10-CM | POA: Diagnosis not present

## 2014-07-12 DIAGNOSIS — R0989 Other specified symptoms and signs involving the circulatory and respiratory systems: Secondary | ICD-10-CM | POA: Diagnosis not present

## 2014-07-12 DIAGNOSIS — R942 Abnormal results of pulmonary function studies: Secondary | ICD-10-CM | POA: Diagnosis not present

## 2014-07-12 DIAGNOSIS — Z79899 Other long term (current) drug therapy: Secondary | ICD-10-CM | POA: Diagnosis not present

## 2014-07-12 DIAGNOSIS — Z4824 Encounter for aftercare following lung transplant: Secondary | ICD-10-CM | POA: Diagnosis not present

## 2014-07-12 DIAGNOSIS — E875 Hyperkalemia: Secondary | ICD-10-CM | POA: Diagnosis not present

## 2014-07-12 DIAGNOSIS — Z48298 Encounter for aftercare following other organ transplant: Secondary | ICD-10-CM | POA: Diagnosis not present

## 2014-07-12 DIAGNOSIS — E785 Hyperlipidemia, unspecified: Secondary | ICD-10-CM | POA: Diagnosis not present

## 2014-07-12 DIAGNOSIS — T86818 Other complications of lung transplant: Secondary | ICD-10-CM | POA: Diagnosis not present

## 2014-07-12 DIAGNOSIS — J8410 Pulmonary fibrosis, unspecified: Secondary | ICD-10-CM | POA: Diagnosis not present

## 2014-07-12 DIAGNOSIS — T86810 Lung transplant rejection: Secondary | ICD-10-CM | POA: Diagnosis not present

## 2014-07-12 DIAGNOSIS — D899 Disorder involving the immune mechanism, unspecified: Secondary | ICD-10-CM | POA: Diagnosis not present

## 2014-07-17 DIAGNOSIS — E780 Pure hypercholesterolemia: Secondary | ICD-10-CM | POA: Diagnosis not present

## 2014-07-17 DIAGNOSIS — I10 Essential (primary) hypertension: Secondary | ICD-10-CM | POA: Diagnosis not present

## 2014-07-17 DIAGNOSIS — E119 Type 2 diabetes mellitus without complications: Secondary | ICD-10-CM | POA: Diagnosis not present

## 2014-07-17 DIAGNOSIS — N29 Other disorders of kidney and ureter in diseases classified elsewhere: Secondary | ICD-10-CM | POA: Diagnosis not present

## 2014-07-26 ENCOUNTER — Encounter: Payer: Self-pay | Admitting: Internal Medicine

## 2014-07-31 ENCOUNTER — Encounter: Payer: Self-pay | Admitting: Internal Medicine

## 2014-08-30 DIAGNOSIS — H524 Presbyopia: Secondary | ICD-10-CM | POA: Diagnosis not present

## 2014-08-30 DIAGNOSIS — H3531 Nonexudative age-related macular degeneration: Secondary | ICD-10-CM | POA: Diagnosis not present

## 2014-10-12 DIAGNOSIS — I10 Essential (primary) hypertension: Secondary | ICD-10-CM | POA: Diagnosis not present

## 2014-10-12 DIAGNOSIS — E119 Type 2 diabetes mellitus without complications: Secondary | ICD-10-CM | POA: Diagnosis not present

## 2014-10-23 DIAGNOSIS — E780 Pure hypercholesterolemia: Secondary | ICD-10-CM | POA: Diagnosis not present

## 2014-10-23 DIAGNOSIS — E119 Type 2 diabetes mellitus without complications: Secondary | ICD-10-CM | POA: Diagnosis not present

## 2014-10-23 DIAGNOSIS — J40 Bronchitis, not specified as acute or chronic: Secondary | ICD-10-CM | POA: Diagnosis not present

## 2014-10-23 DIAGNOSIS — I10 Essential (primary) hypertension: Secondary | ICD-10-CM | POA: Diagnosis not present

## 2014-11-28 DIAGNOSIS — R972 Elevated prostate specific antigen [PSA]: Secondary | ICD-10-CM | POA: Diagnosis not present

## 2014-11-28 DIAGNOSIS — N400 Enlarged prostate without lower urinary tract symptoms: Secondary | ICD-10-CM | POA: Diagnosis not present

## 2014-12-28 DIAGNOSIS — J8410 Pulmonary fibrosis, unspecified: Secondary | ICD-10-CM | POA: Diagnosis not present

## 2014-12-28 DIAGNOSIS — J029 Acute pharyngitis, unspecified: Secondary | ICD-10-CM | POA: Diagnosis not present

## 2014-12-28 DIAGNOSIS — J40 Bronchitis, not specified as acute or chronic: Secondary | ICD-10-CM | POA: Diagnosis not present

## 2014-12-28 DIAGNOSIS — Z942 Lung transplant status: Secondary | ICD-10-CM | POA: Diagnosis not present

## 2015-01-15 DIAGNOSIS — J309 Allergic rhinitis, unspecified: Secondary | ICD-10-CM | POA: Diagnosis not present

## 2015-01-15 DIAGNOSIS — E669 Obesity, unspecified: Secondary | ICD-10-CM | POA: Diagnosis not present

## 2015-01-15 DIAGNOSIS — Z942 Lung transplant status: Secondary | ICD-10-CM | POA: Diagnosis not present

## 2015-01-15 DIAGNOSIS — D899 Disorder involving the immune mechanism, unspecified: Secondary | ICD-10-CM | POA: Diagnosis not present

## 2015-01-15 DIAGNOSIS — E1122 Type 2 diabetes mellitus with diabetic chronic kidney disease: Secondary | ICD-10-CM | POA: Diagnosis not present

## 2015-01-15 DIAGNOSIS — R251 Tremor, unspecified: Secondary | ICD-10-CM | POA: Diagnosis not present

## 2015-01-15 DIAGNOSIS — Z4824 Encounter for aftercare following lung transplant: Secondary | ICD-10-CM | POA: Diagnosis not present

## 2015-01-15 DIAGNOSIS — R918 Other nonspecific abnormal finding of lung field: Secondary | ICD-10-CM | POA: Diagnosis not present

## 2015-01-15 DIAGNOSIS — N182 Chronic kidney disease, stage 2 (mild): Secondary | ICD-10-CM | POA: Diagnosis not present

## 2015-01-15 DIAGNOSIS — J8410 Pulmonary fibrosis, unspecified: Secondary | ICD-10-CM | POA: Diagnosis not present

## 2015-01-15 DIAGNOSIS — Z6831 Body mass index (BMI) 31.0-31.9, adult: Secondary | ICD-10-CM | POA: Diagnosis not present

## 2015-01-15 DIAGNOSIS — Z23 Encounter for immunization: Secondary | ICD-10-CM | POA: Diagnosis not present

## 2015-01-15 DIAGNOSIS — T86810 Lung transplant rejection: Secondary | ICD-10-CM | POA: Diagnosis not present

## 2015-01-15 DIAGNOSIS — R079 Chest pain, unspecified: Secondary | ICD-10-CM | POA: Diagnosis not present

## 2015-01-15 DIAGNOSIS — E785 Hyperlipidemia, unspecified: Secondary | ICD-10-CM | POA: Diagnosis not present

## 2015-02-08 DIAGNOSIS — Z5181 Encounter for therapeutic drug level monitoring: Secondary | ICD-10-CM | POA: Diagnosis not present

## 2015-02-08 DIAGNOSIS — F1120 Opioid dependence, uncomplicated: Secondary | ICD-10-CM | POA: Diagnosis not present

## 2015-02-08 DIAGNOSIS — N644 Mastodynia: Secondary | ICD-10-CM | POA: Diagnosis not present

## 2015-02-15 DIAGNOSIS — E1165 Type 2 diabetes mellitus with hyperglycemia: Secondary | ICD-10-CM | POA: Diagnosis not present

## 2015-03-01 DIAGNOSIS — E119 Type 2 diabetes mellitus without complications: Secondary | ICD-10-CM | POA: Diagnosis not present

## 2015-03-07 DIAGNOSIS — Z942 Lung transplant status: Secondary | ICD-10-CM | POA: Diagnosis not present

## 2015-03-07 DIAGNOSIS — J8410 Pulmonary fibrosis, unspecified: Secondary | ICD-10-CM | POA: Diagnosis not present

## 2015-03-07 DIAGNOSIS — J40 Bronchitis, not specified as acute or chronic: Secondary | ICD-10-CM | POA: Diagnosis not present

## 2015-03-15 DIAGNOSIS — J40 Bronchitis, not specified as acute or chronic: Secondary | ICD-10-CM | POA: Diagnosis not present

## 2015-05-16 DIAGNOSIS — E1165 Type 2 diabetes mellitus with hyperglycemia: Secondary | ICD-10-CM | POA: Diagnosis not present

## 2015-05-28 DIAGNOSIS — Z0000 Encounter for general adult medical examination without abnormal findings: Secondary | ICD-10-CM | POA: Diagnosis not present

## 2015-05-28 DIAGNOSIS — H524 Presbyopia: Secondary | ICD-10-CM | POA: Diagnosis not present

## 2015-05-28 DIAGNOSIS — H35033 Hypertensive retinopathy, bilateral: Secondary | ICD-10-CM | POA: Diagnosis not present

## 2015-05-28 DIAGNOSIS — I10 Essential (primary) hypertension: Secondary | ICD-10-CM | POA: Diagnosis not present

## 2015-05-31 DIAGNOSIS — I10 Essential (primary) hypertension: Secondary | ICD-10-CM | POA: Diagnosis not present

## 2015-05-31 DIAGNOSIS — E7800 Pure hypercholesterolemia, unspecified: Secondary | ICD-10-CM | POA: Diagnosis not present

## 2015-05-31 DIAGNOSIS — Z0000 Encounter for general adult medical examination without abnormal findings: Secondary | ICD-10-CM | POA: Diagnosis not present

## 2015-05-31 DIAGNOSIS — R739 Hyperglycemia, unspecified: Secondary | ICD-10-CM | POA: Diagnosis not present

## 2015-07-16 DIAGNOSIS — Z942 Lung transplant status: Secondary | ICD-10-CM | POA: Diagnosis not present

## 2015-08-23 DIAGNOSIS — R739 Hyperglycemia, unspecified: Secondary | ICD-10-CM | POA: Diagnosis not present

## 2015-08-23 DIAGNOSIS — Z79899 Other long term (current) drug therapy: Secondary | ICD-10-CM | POA: Diagnosis not present

## 2015-08-23 DIAGNOSIS — I10 Essential (primary) hypertension: Secondary | ICD-10-CM | POA: Diagnosis not present

## 2015-08-30 DIAGNOSIS — I10 Essential (primary) hypertension: Secondary | ICD-10-CM | POA: Diagnosis not present

## 2015-08-30 DIAGNOSIS — E7800 Pure hypercholesterolemia, unspecified: Secondary | ICD-10-CM | POA: Diagnosis not present

## 2015-08-30 DIAGNOSIS — E119 Type 2 diabetes mellitus without complications: Secondary | ICD-10-CM | POA: Diagnosis not present

## 2015-09-27 DIAGNOSIS — N183 Chronic kidney disease, stage 3 (moderate): Secondary | ICD-10-CM | POA: Diagnosis not present

## 2015-09-27 DIAGNOSIS — R0782 Intercostal pain: Secondary | ICD-10-CM | POA: Diagnosis not present

## 2015-09-27 DIAGNOSIS — Z886 Allergy status to analgesic agent status: Secondary | ICD-10-CM | POA: Diagnosis not present

## 2015-09-27 DIAGNOSIS — Z48298 Encounter for aftercare following other organ transplant: Secondary | ICD-10-CM | POA: Diagnosis not present

## 2015-09-27 DIAGNOSIS — R0789 Other chest pain: Secondary | ICD-10-CM | POA: Diagnosis not present

## 2015-09-27 DIAGNOSIS — Z942 Lung transplant status: Secondary | ICD-10-CM | POA: Diagnosis not present

## 2015-09-27 DIAGNOSIS — D899 Disorder involving the immune mechanism, unspecified: Secondary | ICD-10-CM | POA: Diagnosis not present

## 2015-09-27 DIAGNOSIS — R942 Abnormal results of pulmonary function studies: Secondary | ICD-10-CM | POA: Diagnosis not present

## 2015-09-27 DIAGNOSIS — Z79899 Other long term (current) drug therapy: Secondary | ICD-10-CM | POA: Diagnosis not present

## 2015-09-27 DIAGNOSIS — E875 Hyperkalemia: Secondary | ICD-10-CM | POA: Diagnosis not present

## 2015-09-27 DIAGNOSIS — Z6830 Body mass index (BMI) 30.0-30.9, adult: Secondary | ICD-10-CM | POA: Diagnosis not present

## 2015-09-27 DIAGNOSIS — T451X5A Adverse effect of antineoplastic and immunosuppressive drugs, initial encounter: Secondary | ICD-10-CM | POA: Diagnosis not present

## 2015-09-27 DIAGNOSIS — J8410 Pulmonary fibrosis, unspecified: Secondary | ICD-10-CM | POA: Diagnosis not present

## 2015-09-27 DIAGNOSIS — Z794 Long term (current) use of insulin: Secondary | ICD-10-CM | POA: Diagnosis not present

## 2015-09-27 DIAGNOSIS — R251 Tremor, unspecified: Secondary | ICD-10-CM | POA: Diagnosis not present

## 2015-09-27 DIAGNOSIS — Z7952 Long term (current) use of systemic steroids: Secondary | ICD-10-CM | POA: Diagnosis not present

## 2015-09-27 DIAGNOSIS — T86819 Unspecified complication of lung transplant: Secondary | ICD-10-CM | POA: Diagnosis not present

## 2015-09-27 DIAGNOSIS — I4891 Unspecified atrial fibrillation: Secondary | ICD-10-CM | POA: Diagnosis not present

## 2015-09-27 DIAGNOSIS — E669 Obesity, unspecified: Secondary | ICD-10-CM | POA: Diagnosis not present

## 2015-09-27 DIAGNOSIS — Z91018 Allergy to other foods: Secondary | ICD-10-CM | POA: Diagnosis not present

## 2015-09-27 DIAGNOSIS — J309 Allergic rhinitis, unspecified: Secondary | ICD-10-CM | POA: Diagnosis not present

## 2015-09-27 DIAGNOSIS — T86810 Lung transplant rejection: Secondary | ICD-10-CM | POA: Diagnosis not present

## 2015-09-27 DIAGNOSIS — E785 Hyperlipidemia, unspecified: Secondary | ICD-10-CM | POA: Diagnosis not present

## 2015-09-27 DIAGNOSIS — Z4824 Encounter for aftercare following lung transplant: Secondary | ICD-10-CM | POA: Diagnosis not present

## 2015-09-27 DIAGNOSIS — I129 Hypertensive chronic kidney disease with stage 1 through stage 4 chronic kidney disease, or unspecified chronic kidney disease: Secondary | ICD-10-CM | POA: Diagnosis not present

## 2015-09-27 DIAGNOSIS — E1122 Type 2 diabetes mellitus with diabetic chronic kidney disease: Secondary | ICD-10-CM | POA: Diagnosis not present

## 2015-10-15 DIAGNOSIS — E113293 Type 2 diabetes mellitus with mild nonproliferative diabetic retinopathy without macular edema, bilateral: Secondary | ICD-10-CM | POA: Diagnosis not present

## 2015-10-24 DIAGNOSIS — Z942 Lung transplant status: Secondary | ICD-10-CM | POA: Diagnosis not present

## 2015-11-05 DIAGNOSIS — Z942 Lung transplant status: Secondary | ICD-10-CM | POA: Diagnosis not present

## 2015-11-19 DIAGNOSIS — Z942 Lung transplant status: Secondary | ICD-10-CM | POA: Diagnosis not present

## 2015-11-20 DIAGNOSIS — J399 Disease of upper respiratory tract, unspecified: Secondary | ICD-10-CM | POA: Diagnosis not present

## 2015-11-27 DIAGNOSIS — N400 Enlarged prostate without lower urinary tract symptoms: Secondary | ICD-10-CM | POA: Diagnosis not present

## 2015-11-27 DIAGNOSIS — R972 Elevated prostate specific antigen [PSA]: Secondary | ICD-10-CM | POA: Diagnosis not present

## 2015-12-31 DIAGNOSIS — E119 Type 2 diabetes mellitus without complications: Secondary | ICD-10-CM | POA: Diagnosis not present

## 2015-12-31 DIAGNOSIS — Z0000 Encounter for general adult medical examination without abnormal findings: Secondary | ICD-10-CM | POA: Diagnosis not present

## 2015-12-31 DIAGNOSIS — I10 Essential (primary) hypertension: Secondary | ICD-10-CM | POA: Diagnosis not present

## 2015-12-31 DIAGNOSIS — E118 Type 2 diabetes mellitus with unspecified complications: Secondary | ICD-10-CM | POA: Diagnosis not present

## 2016-01-07 DIAGNOSIS — I10 Essential (primary) hypertension: Secondary | ICD-10-CM | POA: Diagnosis not present

## 2016-01-07 DIAGNOSIS — E7800 Pure hypercholesterolemia, unspecified: Secondary | ICD-10-CM | POA: Diagnosis not present

## 2016-01-07 DIAGNOSIS — E119 Type 2 diabetes mellitus without complications: Secondary | ICD-10-CM | POA: Diagnosis not present

## 2016-01-07 DIAGNOSIS — Z23 Encounter for immunization: Secondary | ICD-10-CM | POA: Diagnosis not present

## 2016-04-01 DIAGNOSIS — I10 Essential (primary) hypertension: Secondary | ICD-10-CM | POA: Diagnosis not present

## 2016-04-01 DIAGNOSIS — E119 Type 2 diabetes mellitus without complications: Secondary | ICD-10-CM | POA: Diagnosis not present

## 2016-04-08 DIAGNOSIS — I10 Essential (primary) hypertension: Secondary | ICD-10-CM | POA: Diagnosis not present

## 2016-04-08 DIAGNOSIS — Z79891 Long term (current) use of opiate analgesic: Secondary | ICD-10-CM | POA: Diagnosis not present

## 2016-04-08 DIAGNOSIS — R079 Chest pain, unspecified: Secondary | ICD-10-CM | POA: Diagnosis not present

## 2016-04-08 DIAGNOSIS — E119 Type 2 diabetes mellitus without complications: Secondary | ICD-10-CM | POA: Diagnosis not present

## 2016-04-24 ENCOUNTER — Other Ambulatory Visit: Payer: Self-pay

## 2016-04-24 DIAGNOSIS — L820 Inflamed seborrheic keratosis: Secondary | ICD-10-CM | POA: Diagnosis not present

## 2016-04-24 DIAGNOSIS — L821 Other seborrheic keratosis: Secondary | ICD-10-CM | POA: Diagnosis not present

## 2016-04-24 DIAGNOSIS — D492 Neoplasm of unspecified behavior of bone, soft tissue, and skin: Secondary | ICD-10-CM | POA: Diagnosis not present

## 2016-04-24 DIAGNOSIS — D044 Carcinoma in situ of skin of scalp and neck: Secondary | ICD-10-CM | POA: Diagnosis not present

## 2016-04-24 DIAGNOSIS — D229 Melanocytic nevi, unspecified: Secondary | ICD-10-CM | POA: Diagnosis not present

## 2016-05-22 DIAGNOSIS — D044 Carcinoma in situ of skin of scalp and neck: Secondary | ICD-10-CM | POA: Diagnosis not present

## 2016-05-22 DIAGNOSIS — D899 Disorder involving the immune mechanism, unspecified: Secondary | ICD-10-CM | POA: Diagnosis not present

## 2016-05-28 DIAGNOSIS — H52223 Regular astigmatism, bilateral: Secondary | ICD-10-CM | POA: Diagnosis not present

## 2016-05-28 DIAGNOSIS — E113293 Type 2 diabetes mellitus with mild nonproliferative diabetic retinopathy without macular edema, bilateral: Secondary | ICD-10-CM | POA: Diagnosis not present

## 2016-06-23 DIAGNOSIS — Z5181 Encounter for therapeutic drug level monitoring: Secondary | ICD-10-CM | POA: Diagnosis not present

## 2016-06-23 DIAGNOSIS — E119 Type 2 diabetes mellitus without complications: Secondary | ICD-10-CM | POA: Diagnosis not present

## 2016-06-23 DIAGNOSIS — I10 Essential (primary) hypertension: Secondary | ICD-10-CM | POA: Diagnosis not present

## 2016-06-23 DIAGNOSIS — Z79891 Long term (current) use of opiate analgesic: Secondary | ICD-10-CM | POA: Diagnosis not present

## 2016-06-30 DIAGNOSIS — Z942 Lung transplant status: Secondary | ICD-10-CM | POA: Diagnosis not present

## 2016-06-30 DIAGNOSIS — E7800 Pure hypercholesterolemia, unspecified: Secondary | ICD-10-CM | POA: Diagnosis not present

## 2016-06-30 DIAGNOSIS — I10 Essential (primary) hypertension: Secondary | ICD-10-CM | POA: Diagnosis not present

## 2016-06-30 DIAGNOSIS — Z0000 Encounter for general adult medical examination without abnormal findings: Secondary | ICD-10-CM | POA: Diagnosis not present

## 2016-06-30 DIAGNOSIS — E119 Type 2 diabetes mellitus without complications: Secondary | ICD-10-CM | POA: Diagnosis not present

## 2016-06-30 DIAGNOSIS — Z79899 Other long term (current) drug therapy: Secondary | ICD-10-CM | POA: Diagnosis not present

## 2016-09-22 DIAGNOSIS — Z5181 Encounter for therapeutic drug level monitoring: Secondary | ICD-10-CM | POA: Diagnosis not present

## 2016-09-22 DIAGNOSIS — Z79899 Other long term (current) drug therapy: Secondary | ICD-10-CM | POA: Diagnosis not present

## 2016-09-22 DIAGNOSIS — E119 Type 2 diabetes mellitus without complications: Secondary | ICD-10-CM | POA: Diagnosis not present

## 2016-09-22 DIAGNOSIS — I10 Essential (primary) hypertension: Secondary | ICD-10-CM | POA: Diagnosis not present

## 2016-10-10 DIAGNOSIS — I10 Essential (primary) hypertension: Secondary | ICD-10-CM | POA: Diagnosis not present

## 2016-10-10 DIAGNOSIS — E7800 Pure hypercholesterolemia, unspecified: Secondary | ICD-10-CM | POA: Diagnosis not present

## 2016-10-10 DIAGNOSIS — N183 Chronic kidney disease, stage 3 (moderate): Secondary | ICD-10-CM | POA: Diagnosis not present

## 2016-10-10 DIAGNOSIS — E119 Type 2 diabetes mellitus without complications: Secondary | ICD-10-CM | POA: Diagnosis not present

## 2016-10-21 DIAGNOSIS — Z794 Long term (current) use of insulin: Secondary | ICD-10-CM | POA: Diagnosis not present

## 2016-10-21 DIAGNOSIS — B4489 Other forms of aspergillosis: Secondary | ICD-10-CM | POA: Diagnosis not present

## 2016-10-21 DIAGNOSIS — J309 Allergic rhinitis, unspecified: Secondary | ICD-10-CM | POA: Diagnosis not present

## 2016-10-21 DIAGNOSIS — R251 Tremor, unspecified: Secondary | ICD-10-CM | POA: Diagnosis not present

## 2016-10-21 DIAGNOSIS — R079 Chest pain, unspecified: Secondary | ICD-10-CM | POA: Diagnosis not present

## 2016-10-21 DIAGNOSIS — E1122 Type 2 diabetes mellitus with diabetic chronic kidney disease: Secondary | ICD-10-CM | POA: Diagnosis not present

## 2016-10-21 DIAGNOSIS — Z4824 Encounter for aftercare following lung transplant: Secondary | ICD-10-CM | POA: Diagnosis not present

## 2016-10-21 DIAGNOSIS — I4891 Unspecified atrial fibrillation: Secondary | ICD-10-CM | POA: Diagnosis not present

## 2016-10-21 DIAGNOSIS — E785 Hyperlipidemia, unspecified: Secondary | ICD-10-CM | POA: Diagnosis not present

## 2016-10-21 DIAGNOSIS — Z942 Lung transplant status: Secondary | ICD-10-CM | POA: Diagnosis not present

## 2016-10-21 DIAGNOSIS — N183 Chronic kidney disease, stage 3 (moderate): Secondary | ICD-10-CM | POA: Diagnosis not present

## 2016-10-21 DIAGNOSIS — E119 Type 2 diabetes mellitus without complications: Secondary | ICD-10-CM | POA: Diagnosis not present

## 2016-10-21 DIAGNOSIS — Z5181 Encounter for therapeutic drug level monitoring: Secondary | ICD-10-CM | POA: Diagnosis not present

## 2016-10-21 DIAGNOSIS — J8410 Pulmonary fibrosis, unspecified: Secondary | ICD-10-CM | POA: Diagnosis not present

## 2016-10-21 DIAGNOSIS — D899 Disorder involving the immune mechanism, unspecified: Secondary | ICD-10-CM | POA: Diagnosis not present

## 2016-10-21 DIAGNOSIS — T50995D Adverse effect of other drugs, medicaments and biological substances, subsequent encounter: Secondary | ICD-10-CM | POA: Diagnosis not present

## 2016-12-09 DIAGNOSIS — R05 Cough: Secondary | ICD-10-CM | POA: Diagnosis not present

## 2017-01-09 DIAGNOSIS — I10 Essential (primary) hypertension: Secondary | ICD-10-CM | POA: Diagnosis not present

## 2017-01-09 DIAGNOSIS — E119 Type 2 diabetes mellitus without complications: Secondary | ICD-10-CM | POA: Diagnosis not present

## 2017-01-13 DIAGNOSIS — Z942 Lung transplant status: Secondary | ICD-10-CM | POA: Diagnosis not present

## 2017-01-13 DIAGNOSIS — Z87891 Personal history of nicotine dependence: Secondary | ICD-10-CM | POA: Diagnosis not present

## 2017-01-13 DIAGNOSIS — R251 Tremor, unspecified: Secondary | ICD-10-CM | POA: Diagnosis not present

## 2017-01-13 DIAGNOSIS — Z4824 Encounter for aftercare following lung transplant: Secondary | ICD-10-CM | POA: Diagnosis not present

## 2017-01-13 DIAGNOSIS — D899 Disorder involving the immune mechanism, unspecified: Secondary | ICD-10-CM | POA: Diagnosis not present

## 2017-01-13 DIAGNOSIS — N183 Chronic kidney disease, stage 3 (moderate): Secondary | ICD-10-CM | POA: Diagnosis not present

## 2017-01-13 DIAGNOSIS — R942 Abnormal results of pulmonary function studies: Secondary | ICD-10-CM | POA: Diagnosis not present

## 2017-01-13 DIAGNOSIS — Z794 Long term (current) use of insulin: Secondary | ICD-10-CM | POA: Diagnosis not present

## 2017-01-13 DIAGNOSIS — R0789 Other chest pain: Secondary | ICD-10-CM | POA: Diagnosis not present

## 2017-01-13 DIAGNOSIS — E1122 Type 2 diabetes mellitus with diabetic chronic kidney disease: Secondary | ICD-10-CM | POA: Diagnosis not present

## 2017-01-13 DIAGNOSIS — Z7952 Long term (current) use of systemic steroids: Secondary | ICD-10-CM | POA: Diagnosis not present

## 2017-01-13 DIAGNOSIS — J8410 Pulmonary fibrosis, unspecified: Secondary | ICD-10-CM | POA: Diagnosis not present

## 2017-01-13 DIAGNOSIS — Z79899 Other long term (current) drug therapy: Secondary | ICD-10-CM | POA: Diagnosis not present

## 2017-01-13 DIAGNOSIS — I440 Atrioventricular block, first degree: Secondary | ICD-10-CM | POA: Diagnosis not present

## 2017-01-13 DIAGNOSIS — N400 Enlarged prostate without lower urinary tract symptoms: Secondary | ICD-10-CM | POA: Diagnosis not present

## 2017-01-13 DIAGNOSIS — G8928 Other chronic postprocedural pain: Secondary | ICD-10-CM | POA: Diagnosis not present

## 2017-01-13 DIAGNOSIS — Z886 Allergy status to analgesic agent status: Secondary | ICD-10-CM | POA: Diagnosis not present

## 2017-01-13 DIAGNOSIS — I129 Hypertensive chronic kidney disease with stage 1 through stage 4 chronic kidney disease, or unspecified chronic kidney disease: Secondary | ICD-10-CM | POA: Diagnosis not present

## 2017-01-14 DIAGNOSIS — R251 Tremor, unspecified: Secondary | ICD-10-CM | POA: Diagnosis not present

## 2017-01-14 DIAGNOSIS — I10 Essential (primary) hypertension: Secondary | ICD-10-CM | POA: Diagnosis not present

## 2017-01-14 DIAGNOSIS — R079 Chest pain, unspecified: Secondary | ICD-10-CM | POA: Diagnosis not present

## 2017-01-14 DIAGNOSIS — Z4824 Encounter for aftercare following lung transplant: Secondary | ICD-10-CM | POA: Diagnosis not present

## 2017-01-14 DIAGNOSIS — E119 Type 2 diabetes mellitus without complications: Secondary | ICD-10-CM | POA: Diagnosis not present

## 2017-01-14 DIAGNOSIS — Z87891 Personal history of nicotine dependence: Secondary | ICD-10-CM | POA: Diagnosis not present

## 2017-01-14 DIAGNOSIS — J309 Allergic rhinitis, unspecified: Secondary | ICD-10-CM | POA: Diagnosis not present

## 2017-01-14 DIAGNOSIS — E785 Hyperlipidemia, unspecified: Secondary | ICD-10-CM | POA: Diagnosis not present

## 2017-01-14 DIAGNOSIS — N183 Chronic kidney disease, stage 3 (moderate): Secondary | ICD-10-CM | POA: Diagnosis not present

## 2017-01-14 DIAGNOSIS — D899 Disorder involving the immune mechanism, unspecified: Secondary | ICD-10-CM | POA: Diagnosis not present

## 2017-01-14 DIAGNOSIS — Z794 Long term (current) use of insulin: Secondary | ICD-10-CM | POA: Diagnosis not present

## 2017-01-14 DIAGNOSIS — T86819 Unspecified complication of lung transplant: Secondary | ICD-10-CM | POA: Diagnosis not present

## 2017-01-14 DIAGNOSIS — Z5181 Encounter for therapeutic drug level monitoring: Secondary | ICD-10-CM | POA: Diagnosis not present

## 2017-01-16 DIAGNOSIS — Z942 Lung transplant status: Secondary | ICD-10-CM | POA: Diagnosis not present

## 2017-01-16 DIAGNOSIS — E118 Type 2 diabetes mellitus with unspecified complications: Secondary | ICD-10-CM | POA: Diagnosis not present

## 2017-01-16 DIAGNOSIS — N183 Chronic kidney disease, stage 3 (moderate): Secondary | ICD-10-CM | POA: Diagnosis not present

## 2017-01-17 ENCOUNTER — Emergency Department (HOSPITAL_COMMUNITY): Payer: No Typology Code available for payment source

## 2017-01-17 ENCOUNTER — Emergency Department (HOSPITAL_COMMUNITY)
Admission: EM | Admit: 2017-01-17 | Discharge: 2017-01-17 | Disposition: A | Discharge status: Home / Self Care | Payer: No Typology Code available for payment source | Attending: Emergency Medicine | Admitting: Emergency Medicine

## 2017-01-17 ENCOUNTER — Emergency Department (HOSPITAL_COMMUNITY)
Admission: EM | Admit: 2017-01-17 | Discharge: 2017-01-17 | Disposition: A | Discharge status: Home / Self Care | Payer: No Typology Code available for payment source | Source: Home / Self Care | Attending: Emergency Medicine | Admitting: Emergency Medicine

## 2017-01-17 ENCOUNTER — Other Ambulatory Visit: Payer: Self-pay

## 2017-01-17 ENCOUNTER — Encounter (HOSPITAL_COMMUNITY): Payer: Self-pay

## 2017-01-17 DIAGNOSIS — E119 Type 2 diabetes mellitus without complications: Secondary | ICD-10-CM | POA: Insufficient documentation

## 2017-01-17 DIAGNOSIS — Y9389 Activity, other specified: Secondary | ICD-10-CM | POA: Insufficient documentation

## 2017-01-17 DIAGNOSIS — I10 Essential (primary) hypertension: Secondary | ICD-10-CM | POA: Insufficient documentation

## 2017-01-17 DIAGNOSIS — M545 Low back pain: Secondary | ICD-10-CM | POA: Insufficient documentation

## 2017-01-17 DIAGNOSIS — Y999 Unspecified external cause status: Secondary | ICD-10-CM | POA: Insufficient documentation

## 2017-01-17 DIAGNOSIS — M7918 Myalgia, other site: Secondary | ICD-10-CM | POA: Diagnosis not present

## 2017-01-17 DIAGNOSIS — S3992XA Unspecified injury of lower back, initial encounter: Secondary | ICD-10-CM | POA: Diagnosis present

## 2017-01-17 DIAGNOSIS — Z942 Lung transplant status: Secondary | ICD-10-CM | POA: Insufficient documentation

## 2017-01-17 DIAGNOSIS — M5489 Other dorsalgia: Secondary | ICD-10-CM | POA: Diagnosis not present

## 2017-01-17 DIAGNOSIS — Y929 Unspecified place or not applicable: Secondary | ICD-10-CM | POA: Insufficient documentation

## 2017-01-17 DIAGNOSIS — Z87891 Personal history of nicotine dependence: Secondary | ICD-10-CM | POA: Insufficient documentation

## 2017-01-17 DIAGNOSIS — Z794 Long term (current) use of insulin: Secondary | ICD-10-CM | POA: Insufficient documentation

## 2017-01-17 DIAGNOSIS — R1011 Right upper quadrant pain: Secondary | ICD-10-CM | POA: Insufficient documentation

## 2017-01-17 DIAGNOSIS — S39012A Strain of muscle, fascia and tendon of lower back, initial encounter: Secondary | ICD-10-CM | POA: Insufficient documentation

## 2017-01-17 DIAGNOSIS — Z79899 Other long term (current) drug therapy: Secondary | ICD-10-CM | POA: Insufficient documentation

## 2017-01-17 DIAGNOSIS — R109 Unspecified abdominal pain: Secondary | ICD-10-CM | POA: Diagnosis not present

## 2017-01-17 DIAGNOSIS — T148XXA Other injury of unspecified body region, initial encounter: Secondary | ICD-10-CM | POA: Diagnosis not present

## 2017-01-17 DIAGNOSIS — M791 Myalgia, unspecified site: Secondary | ICD-10-CM | POA: Diagnosis not present

## 2017-01-17 LAB — CBC WITH DIFFERENTIAL/PLATELET
BASOS ABS: 0 10*3/uL (ref 0.0–0.1)
BASOS PCT: 0 %
EOS ABS: 0.1 10*3/uL (ref 0.0–0.7)
EOS PCT: 1 %
HEMATOCRIT: 39.4 % (ref 39.0–52.0)
Hemoglobin: 13.1 g/dL (ref 13.0–17.0)
Lymphocytes Relative: 20 %
Lymphs Abs: 1.8 10*3/uL (ref 0.7–4.0)
MCH: 29.6 pg (ref 26.0–34.0)
MCHC: 33.2 g/dL (ref 30.0–36.0)
MCV: 89.1 fL (ref 78.0–100.0)
MONO ABS: 0.5 10*3/uL (ref 0.1–1.0)
Monocytes Relative: 6 %
NEUTROS ABS: 6.6 10*3/uL (ref 1.7–7.7)
Neutrophils Relative %: 73 %
PLATELETS: 242 10*3/uL (ref 150–400)
RBC: 4.42 MIL/uL (ref 4.22–5.81)
RDW: 12.7 % (ref 11.5–15.5)
WBC: 9.1 10*3/uL (ref 4.0–10.5)

## 2017-01-17 LAB — COMPREHENSIVE METABOLIC PANEL
ALBUMIN: 4.1 g/dL (ref 3.5–5.0)
ALT: 16 U/L — ABNORMAL LOW (ref 17–63)
AST: 25 U/L (ref 15–41)
Alkaline Phosphatase: 93 U/L (ref 38–126)
Anion gap: 11 (ref 5–15)
BUN: 31 mg/dL — AB (ref 6–20)
CHLORIDE: 101 mmol/L (ref 101–111)
CO2: 25 mmol/L (ref 22–32)
Calcium: 9.2 mg/dL (ref 8.9–10.3)
Creatinine, Ser: 1.55 mg/dL — ABNORMAL HIGH (ref 0.61–1.24)
GFR calc Af Amer: 48 mL/min — ABNORMAL LOW (ref >60)
GFR calc non Af Amer: 42 mL/min — ABNORMAL LOW (ref >60)
GLUCOSE: 334 mg/dL — AB (ref 65–99)
POTASSIUM: 4.8 mmol/L (ref 3.5–5.1)
SODIUM: 137 mmol/L (ref 135–145)
Total Bilirubin: 0.6 mg/dL (ref 0.3–1.2)
Total Protein: 7.5 g/dL (ref 6.5–8.1)

## 2017-01-17 MED ORDER — SODIUM CHLORIDE 0.9 % IV BOLUS (SEPSIS)
1000.0000 mL | Freq: Once | INTRAVENOUS | Status: AC
  Administered 2017-01-17: 1000 mL via INTRAVENOUS

## 2017-01-17 MED ORDER — IOPAMIDOL (ISOVUE-300) INJECTION 61%
INTRAVENOUS | Status: AC
  Filled 2017-01-17: qty 75

## 2017-01-17 MED ORDER — IOPAMIDOL (ISOVUE-300) INJECTION 61%
75.0000 mL | Freq: Once | INTRAVENOUS | Status: AC | PRN
  Administered 2017-01-17: 75 mL via INTRAVENOUS

## 2017-01-17 MED ORDER — IOPAMIDOL (ISOVUE-300) INJECTION 61%: 100.0000 mL | Freq: Once | INTRAVENOUS | Status: DC | PRN

## 2017-01-17 MED ORDER — MORPHINE SULFATE (PF) 4 MG/ML IV SOLN
4.0000 mg | Freq: Once | INTRAVENOUS | Status: AC
Start: 2017-01-17 — End: 2017-01-17
  Administered 2017-01-17: 4 mg via INTRAVENOUS
  Filled 2017-01-17: qty 1

## 2017-01-17 MED ORDER — METHOCARBAMOL 500 MG PO TABS: 500.0000 mg | ORAL_TABLET | Freq: Two times a day (BID) | ORAL | 0 refills | Status: DC
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
## 2017-01-17 NOTE — ED Notes (Signed)
Bed: LK58 Expected date:  Expected time:  Means of arrival:  Comments: TRI 6
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
## 2017-04-07 DIAGNOSIS — E113293 Type 2 diabetes mellitus with mild nonproliferative diabetic retinopathy without macular edema, bilateral: Secondary | ICD-10-CM | POA: Diagnosis not present

## 2017-04-07 DIAGNOSIS — H524 Presbyopia: Secondary | ICD-10-CM | POA: Diagnosis not present

## 2017-04-13 DIAGNOSIS — E118 Type 2 diabetes mellitus with unspecified complications: Secondary | ICD-10-CM | POA: Diagnosis not present

## 2017-04-13 DIAGNOSIS — N183 Chronic kidney disease, stage 3 (moderate): Secondary | ICD-10-CM | POA: Diagnosis not present

## 2017-04-15 DIAGNOSIS — H353131 Nonexudative age-related macular degeneration, bilateral, early dry stage: Secondary | ICD-10-CM | POA: Diagnosis not present

## 2017-04-20 DIAGNOSIS — E7800 Pure hypercholesterolemia, unspecified: Secondary | ICD-10-CM | POA: Diagnosis not present

## 2017-04-20 DIAGNOSIS — R0789 Other chest pain: Secondary | ICD-10-CM | POA: Diagnosis not present

## 2017-04-20 DIAGNOSIS — I10 Essential (primary) hypertension: Secondary | ICD-10-CM | POA: Diagnosis not present

## 2017-04-20 DIAGNOSIS — E119 Type 2 diabetes mellitus without complications: Secondary | ICD-10-CM | POA: Diagnosis not present

## 2017-05-11 DIAGNOSIS — N400 Enlarged prostate without lower urinary tract symptoms: Secondary | ICD-10-CM | POA: Diagnosis not present

## 2017-05-11 DIAGNOSIS — N401 Enlarged prostate with lower urinary tract symptoms: Secondary | ICD-10-CM | POA: Diagnosis not present

## 2017-05-11 DIAGNOSIS — Z87891 Personal history of nicotine dependence: Secondary | ICD-10-CM | POA: Diagnosis not present

## 2017-05-11 DIAGNOSIS — Z794 Long term (current) use of insulin: Secondary | ICD-10-CM | POA: Diagnosis not present

## 2017-05-11 DIAGNOSIS — E119 Type 2 diabetes mellitus without complications: Secondary | ICD-10-CM | POA: Diagnosis not present

## 2017-05-11 DIAGNOSIS — R3915 Urgency of urination: Secondary | ICD-10-CM | POA: Diagnosis not present

## 2017-05-11 DIAGNOSIS — R972 Elevated prostate specific antigen [PSA]: Secondary | ICD-10-CM | POA: Diagnosis not present

## 2017-05-11 DIAGNOSIS — E785 Hyperlipidemia, unspecified: Secondary | ICD-10-CM | POA: Diagnosis not present

## 2017-05-11 DIAGNOSIS — Z942 Lung transplant status: Secondary | ICD-10-CM | POA: Diagnosis not present

## 2017-05-11 DIAGNOSIS — Z79899 Other long term (current) drug therapy: Secondary | ICD-10-CM | POA: Diagnosis not present

## 2017-06-12 DIAGNOSIS — Z79899 Other long term (current) drug therapy: Secondary | ICD-10-CM | POA: Diagnosis not present

## 2017-06-12 DIAGNOSIS — Z942 Lung transplant status: Secondary | ICD-10-CM | POA: Diagnosis not present

## 2017-07-14 DIAGNOSIS — E785 Hyperlipidemia, unspecified: Secondary | ICD-10-CM | POA: Diagnosis not present

## 2017-07-14 DIAGNOSIS — Z4824 Encounter for aftercare following lung transplant: Secondary | ICD-10-CM | POA: Diagnosis not present

## 2017-07-14 DIAGNOSIS — M438X6 Other specified deforming dorsopathies, lumbar region: Secondary | ICD-10-CM | POA: Diagnosis not present

## 2017-07-14 DIAGNOSIS — D899 Disorder involving the immune mechanism, unspecified: Secondary | ICD-10-CM | POA: Diagnosis not present

## 2017-07-14 DIAGNOSIS — E7800 Pure hypercholesterolemia, unspecified: Secondary | ICD-10-CM | POA: Diagnosis not present

## 2017-07-14 DIAGNOSIS — R251 Tremor, unspecified: Secondary | ICD-10-CM | POA: Diagnosis not present

## 2017-07-14 DIAGNOSIS — J309 Allergic rhinitis, unspecified: Secondary | ICD-10-CM | POA: Diagnosis not present

## 2017-07-14 DIAGNOSIS — Z5181 Encounter for therapeutic drug level monitoring: Secondary | ICD-10-CM | POA: Diagnosis not present

## 2017-07-14 DIAGNOSIS — Z942 Lung transplant status: Secondary | ICD-10-CM | POA: Diagnosis not present

## 2017-07-14 DIAGNOSIS — R918 Other nonspecific abnormal finding of lung field: Secondary | ICD-10-CM | POA: Diagnosis not present

## 2017-07-14 DIAGNOSIS — E119 Type 2 diabetes mellitus without complications: Secondary | ICD-10-CM | POA: Diagnosis not present

## 2017-07-14 DIAGNOSIS — R079 Chest pain, unspecified: Secondary | ICD-10-CM | POA: Diagnosis not present

## 2017-07-14 DIAGNOSIS — E875 Hyperkalemia: Secondary | ICD-10-CM | POA: Diagnosis not present

## 2017-07-14 DIAGNOSIS — N183 Chronic kidney disease, stage 3 (moderate): Secondary | ICD-10-CM | POA: Diagnosis not present

## 2017-07-14 DIAGNOSIS — Z87891 Personal history of nicotine dependence: Secondary | ICD-10-CM | POA: Diagnosis not present

## 2017-07-14 DIAGNOSIS — Z79899 Other long term (current) drug therapy: Secondary | ICD-10-CM | POA: Diagnosis not present

## 2017-07-21 DIAGNOSIS — I10 Essential (primary) hypertension: Secondary | ICD-10-CM | POA: Diagnosis not present

## 2017-07-21 DIAGNOSIS — Z79891 Long term (current) use of opiate analgesic: Secondary | ICD-10-CM | POA: Diagnosis not present

## 2017-07-21 DIAGNOSIS — E7800 Pure hypercholesterolemia, unspecified: Secondary | ICD-10-CM | POA: Diagnosis not present

## 2017-07-21 DIAGNOSIS — E119 Type 2 diabetes mellitus without complications: Secondary | ICD-10-CM | POA: Diagnosis not present

## 2017-07-24 DIAGNOSIS — N400 Enlarged prostate without lower urinary tract symptoms: Secondary | ICD-10-CM | POA: Diagnosis not present

## 2017-07-24 DIAGNOSIS — E1122 Type 2 diabetes mellitus with diabetic chronic kidney disease: Secondary | ICD-10-CM | POA: Diagnosis not present

## 2017-07-24 DIAGNOSIS — Z4824 Encounter for aftercare following lung transplant: Secondary | ICD-10-CM | POA: Diagnosis not present

## 2017-07-24 DIAGNOSIS — J9809 Other diseases of bronchus, not elsewhere classified: Secondary | ICD-10-CM | POA: Diagnosis not present

## 2017-07-24 DIAGNOSIS — Z79899 Other long term (current) drug therapy: Secondary | ICD-10-CM | POA: Diagnosis not present

## 2017-07-24 DIAGNOSIS — Z794 Long term (current) use of insulin: Secondary | ICD-10-CM | POA: Diagnosis not present

## 2017-07-24 DIAGNOSIS — Z942 Lung transplant status: Secondary | ICD-10-CM | POA: Diagnosis not present

## 2017-07-24 DIAGNOSIS — E785 Hyperlipidemia, unspecified: Secondary | ICD-10-CM | POA: Diagnosis not present

## 2017-07-24 DIAGNOSIS — Z886 Allergy status to analgesic agent status: Secondary | ICD-10-CM | POA: Diagnosis not present

## 2017-07-24 DIAGNOSIS — I129 Hypertensive chronic kidney disease with stage 1 through stage 4 chronic kidney disease, or unspecified chronic kidney disease: Secondary | ICD-10-CM | POA: Diagnosis not present

## 2017-07-24 DIAGNOSIS — Z87891 Personal history of nicotine dependence: Secondary | ICD-10-CM | POA: Diagnosis not present

## 2017-07-24 DIAGNOSIS — N183 Chronic kidney disease, stage 3 (moderate): Secondary | ICD-10-CM | POA: Diagnosis not present

## 2017-10-15 DIAGNOSIS — E119 Type 2 diabetes mellitus without complications: Secondary | ICD-10-CM | POA: Diagnosis not present

## 2017-10-15 DIAGNOSIS — I10 Essential (primary) hypertension: Secondary | ICD-10-CM | POA: Diagnosis not present

## 2017-10-15 DIAGNOSIS — Z79891 Long term (current) use of opiate analgesic: Secondary | ICD-10-CM | POA: Diagnosis not present

## 2017-10-15 DIAGNOSIS — Z5181 Encounter for therapeutic drug level monitoring: Secondary | ICD-10-CM | POA: Diagnosis not present

## 2017-10-22 DIAGNOSIS — E7800 Pure hypercholesterolemia, unspecified: Secondary | ICD-10-CM | POA: Diagnosis not present

## 2017-10-22 DIAGNOSIS — E119 Type 2 diabetes mellitus without complications: Secondary | ICD-10-CM | POA: Diagnosis not present

## 2017-10-22 DIAGNOSIS — I10 Essential (primary) hypertension: Secondary | ICD-10-CM | POA: Diagnosis not present

## 2017-10-22 DIAGNOSIS — Z79891 Long term (current) use of opiate analgesic: Secondary | ICD-10-CM | POA: Diagnosis not present

## 2017-10-30 DIAGNOSIS — E785 Hyperlipidemia, unspecified: Secondary | ICD-10-CM | POA: Diagnosis not present

## 2017-10-30 DIAGNOSIS — Z5181 Encounter for therapeutic drug level monitoring: Secondary | ICD-10-CM | POA: Diagnosis not present

## 2017-10-30 DIAGNOSIS — Z87891 Personal history of nicotine dependence: Secondary | ICD-10-CM | POA: Diagnosis not present

## 2017-10-30 DIAGNOSIS — I129 Hypertensive chronic kidney disease with stage 1 through stage 4 chronic kidney disease, or unspecified chronic kidney disease: Secondary | ICD-10-CM | POA: Diagnosis not present

## 2017-10-30 DIAGNOSIS — Z794 Long term (current) use of insulin: Secondary | ICD-10-CM | POA: Diagnosis not present

## 2017-10-30 DIAGNOSIS — E1122 Type 2 diabetes mellitus with diabetic chronic kidney disease: Secondary | ICD-10-CM | POA: Diagnosis not present

## 2017-10-30 DIAGNOSIS — Z8619 Personal history of other infectious and parasitic diseases: Secondary | ICD-10-CM | POA: Diagnosis not present

## 2017-10-30 DIAGNOSIS — Z79899 Other long term (current) drug therapy: Secondary | ICD-10-CM | POA: Diagnosis not present

## 2017-10-30 DIAGNOSIS — N183 Chronic kidney disease, stage 3 (moderate): Secondary | ICD-10-CM | POA: Diagnosis not present

## 2017-10-30 DIAGNOSIS — J8410 Pulmonary fibrosis, unspecified: Secondary | ICD-10-CM | POA: Diagnosis not present

## 2017-10-30 DIAGNOSIS — Z942 Lung transplant status: Secondary | ICD-10-CM | POA: Diagnosis not present

## 2017-10-30 DIAGNOSIS — Z4824 Encounter for aftercare following lung transplant: Secondary | ICD-10-CM | POA: Diagnosis not present

## 2017-10-30 DIAGNOSIS — R918 Other nonspecific abnormal finding of lung field: Secondary | ICD-10-CM | POA: Diagnosis not present

## 2017-10-30 DIAGNOSIS — D899 Disorder involving the immune mechanism, unspecified: Secondary | ICD-10-CM | POA: Diagnosis not present

## 2017-11-10 DIAGNOSIS — J069 Acute upper respiratory infection, unspecified: Secondary | ICD-10-CM | POA: Diagnosis not present

## 2017-11-10 DIAGNOSIS — R05 Cough: Secondary | ICD-10-CM | POA: Diagnosis not present

## 2017-11-10 DIAGNOSIS — E118 Type 2 diabetes mellitus with unspecified complications: Secondary | ICD-10-CM | POA: Diagnosis not present

## 2017-11-20 DIAGNOSIS — H26492 Other secondary cataract, left eye: Secondary | ICD-10-CM | POA: Diagnosis not present

## 2017-11-20 DIAGNOSIS — I10 Essential (primary) hypertension: Secondary | ICD-10-CM | POA: Diagnosis not present

## 2017-11-20 DIAGNOSIS — Z794 Long term (current) use of insulin: Secondary | ICD-10-CM | POA: Diagnosis not present

## 2017-11-20 DIAGNOSIS — E119 Type 2 diabetes mellitus without complications: Secondary | ICD-10-CM | POA: Diagnosis not present

## 2017-11-26 DIAGNOSIS — H26493 Other secondary cataract, bilateral: Secondary | ICD-10-CM | POA: Diagnosis not present

## 2017-12-07 DIAGNOSIS — E119 Type 2 diabetes mellitus without complications: Secondary | ICD-10-CM | POA: Diagnosis not present

## 2017-12-07 DIAGNOSIS — I10 Essential (primary) hypertension: Secondary | ICD-10-CM | POA: Diagnosis not present

## 2017-12-07 DIAGNOSIS — Z794 Long term (current) use of insulin: Secondary | ICD-10-CM | POA: Diagnosis not present

## 2017-12-07 DIAGNOSIS — H26491 Other secondary cataract, right eye: Secondary | ICD-10-CM | POA: Diagnosis not present

## 2017-12-14 DIAGNOSIS — H26493 Other secondary cataract, bilateral: Secondary | ICD-10-CM | POA: Diagnosis not present

## 2018-01-06 DIAGNOSIS — Z4824 Encounter for aftercare following lung transplant: Secondary | ICD-10-CM | POA: Diagnosis not present

## 2018-01-06 DIAGNOSIS — M7989 Other specified soft tissue disorders: Secondary | ICD-10-CM | POA: Diagnosis not present

## 2018-01-06 DIAGNOSIS — Z942 Lung transplant status: Secondary | ICD-10-CM | POA: Diagnosis not present

## 2018-01-06 DIAGNOSIS — Z48298 Encounter for aftercare following other organ transplant: Secondary | ICD-10-CM | POA: Diagnosis not present

## 2018-01-06 DIAGNOSIS — R0600 Dyspnea, unspecified: Secondary | ICD-10-CM | POA: Diagnosis not present

## 2018-01-06 DIAGNOSIS — R251 Tremor, unspecified: Secondary | ICD-10-CM | POA: Diagnosis not present

## 2018-01-06 DIAGNOSIS — Z87891 Personal history of nicotine dependence: Secondary | ICD-10-CM | POA: Diagnosis not present

## 2018-01-06 DIAGNOSIS — E785 Hyperlipidemia, unspecified: Secondary | ICD-10-CM | POA: Diagnosis not present

## 2018-01-06 DIAGNOSIS — R0602 Shortness of breath: Secondary | ICD-10-CM | POA: Diagnosis not present

## 2018-01-06 DIAGNOSIS — J309 Allergic rhinitis, unspecified: Secondary | ICD-10-CM | POA: Diagnosis not present

## 2018-01-06 DIAGNOSIS — D899 Disorder involving the immune mechanism, unspecified: Secondary | ICD-10-CM | POA: Diagnosis not present

## 2018-01-06 DIAGNOSIS — N183 Chronic kidney disease, stage 3 (moderate): Secondary | ICD-10-CM | POA: Diagnosis not present

## 2018-01-06 DIAGNOSIS — K8020 Calculus of gallbladder without cholecystitis without obstruction: Secondary | ICD-10-CM | POA: Diagnosis not present

## 2018-01-08 DIAGNOSIS — Z79899 Other long term (current) drug therapy: Secondary | ICD-10-CM | POA: Diagnosis not present

## 2018-01-08 DIAGNOSIS — T86810 Lung transplant rejection: Secondary | ICD-10-CM | POA: Diagnosis not present

## 2018-01-08 DIAGNOSIS — Z4824 Encounter for aftercare following lung transplant: Secondary | ICD-10-CM | POA: Diagnosis not present

## 2018-01-08 DIAGNOSIS — T86818 Other complications of lung transplant: Secondary | ICD-10-CM | POA: Diagnosis not present

## 2018-01-08 DIAGNOSIS — E119 Type 2 diabetes mellitus without complications: Secondary | ICD-10-CM | POA: Diagnosis not present

## 2018-01-08 DIAGNOSIS — Z87891 Personal history of nicotine dependence: Secondary | ICD-10-CM | POA: Diagnosis not present

## 2018-01-08 DIAGNOSIS — R942 Abnormal results of pulmonary function studies: Secondary | ICD-10-CM | POA: Diagnosis not present

## 2018-01-08 DIAGNOSIS — Z794 Long term (current) use of insulin: Secondary | ICD-10-CM | POA: Diagnosis not present

## 2018-01-08 DIAGNOSIS — Z8709 Personal history of other diseases of the respiratory system: Secondary | ICD-10-CM | POA: Diagnosis not present

## 2018-01-19 DIAGNOSIS — Z942 Lung transplant status: Secondary | ICD-10-CM | POA: Diagnosis not present

## 2018-01-19 DIAGNOSIS — T86810 Lung transplant rejection: Secondary | ICD-10-CM | POA: Diagnosis not present

## 2018-01-21 DIAGNOSIS — Z4824 Encounter for aftercare following lung transplant: Secondary | ICD-10-CM | POA: Diagnosis not present

## 2018-01-21 DIAGNOSIS — K689 Other disorders of retroperitoneum: Secondary | ICD-10-CM | POA: Diagnosis not present

## 2018-01-21 DIAGNOSIS — T86818 Other complications of lung transplant: Secondary | ICD-10-CM | POA: Diagnosis not present

## 2018-01-21 DIAGNOSIS — Z79899 Other long term (current) drug therapy: Secondary | ICD-10-CM | POA: Diagnosis not present

## 2018-01-21 DIAGNOSIS — N183 Chronic kidney disease, stage 3 (moderate): Secondary | ICD-10-CM | POA: Diagnosis not present

## 2018-01-21 DIAGNOSIS — D899 Disorder involving the immune mechanism, unspecified: Secondary | ICD-10-CM | POA: Diagnosis not present

## 2018-01-21 DIAGNOSIS — Z5181 Encounter for therapeutic drug level monitoring: Secondary | ICD-10-CM | POA: Diagnosis not present

## 2018-01-21 DIAGNOSIS — E785 Hyperlipidemia, unspecified: Secondary | ICD-10-CM | POA: Diagnosis not present

## 2018-01-21 DIAGNOSIS — J309 Allergic rhinitis, unspecified: Secondary | ICD-10-CM | POA: Diagnosis not present

## 2018-01-21 DIAGNOSIS — R251 Tremor, unspecified: Secondary | ICD-10-CM | POA: Diagnosis not present

## 2018-01-23 DIAGNOSIS — T86810 Lung transplant rejection: Secondary | ICD-10-CM | POA: Insufficient documentation

## 2018-01-28 DIAGNOSIS — E782 Mixed hyperlipidemia: Secondary | ICD-10-CM | POA: Diagnosis not present

## 2018-01-28 DIAGNOSIS — Z79891 Long term (current) use of opiate analgesic: Secondary | ICD-10-CM | POA: Diagnosis not present

## 2018-01-28 DIAGNOSIS — E7800 Pure hypercholesterolemia, unspecified: Secondary | ICD-10-CM | POA: Diagnosis not present

## 2018-01-28 DIAGNOSIS — E118 Type 2 diabetes mellitus with unspecified complications: Secondary | ICD-10-CM | POA: Diagnosis not present

## 2018-01-28 DIAGNOSIS — N183 Chronic kidney disease, stage 3 (moderate): Secondary | ICD-10-CM | POA: Diagnosis not present

## 2018-01-28 DIAGNOSIS — Z0000 Encounter for general adult medical examination without abnormal findings: Secondary | ICD-10-CM | POA: Diagnosis not present

## 2018-02-03 DIAGNOSIS — Z79891 Long term (current) use of opiate analgesic: Secondary | ICD-10-CM | POA: Diagnosis not present

## 2018-02-03 DIAGNOSIS — Z0000 Encounter for general adult medical examination without abnormal findings: Secondary | ICD-10-CM | POA: Diagnosis not present

## 2018-02-11 DIAGNOSIS — D899 Disorder involving the immune mechanism, unspecified: Secondary | ICD-10-CM | POA: Diagnosis not present

## 2018-02-11 DIAGNOSIS — R509 Fever, unspecified: Secondary | ICD-10-CM | POA: Diagnosis not present

## 2018-02-11 DIAGNOSIS — R739 Hyperglycemia, unspecified: Secondary | ICD-10-CM | POA: Diagnosis not present

## 2018-02-11 DIAGNOSIS — N183 Chronic kidney disease, stage 3 unspecified: Secondary | ICD-10-CM | POA: Insufficient documentation

## 2018-02-11 DIAGNOSIS — R0600 Dyspnea, unspecified: Secondary | ICD-10-CM | POA: Diagnosis not present

## 2018-02-11 DIAGNOSIS — R05 Cough: Secondary | ICD-10-CM | POA: Diagnosis not present

## 2018-02-11 DIAGNOSIS — T380X5A Adverse effect of glucocorticoids and synthetic analogues, initial encounter: Secondary | ICD-10-CM | POA: Diagnosis not present

## 2018-02-11 DIAGNOSIS — Z48298 Encounter for aftercare following other organ transplant: Secondary | ICD-10-CM | POA: Diagnosis not present

## 2018-02-11 DIAGNOSIS — Z79899 Other long term (current) drug therapy: Secondary | ICD-10-CM | POA: Diagnosis not present

## 2018-02-11 DIAGNOSIS — J9601 Acute respiratory failure with hypoxia: Secondary | ICD-10-CM | POA: Diagnosis not present

## 2018-02-11 DIAGNOSIS — E1165 Type 2 diabetes mellitus with hyperglycemia: Secondary | ICD-10-CM | POA: Diagnosis not present

## 2018-02-11 DIAGNOSIS — E119 Type 2 diabetes mellitus without complications: Secondary | ICD-10-CM | POA: Diagnosis not present

## 2018-02-11 DIAGNOSIS — R079 Chest pain, unspecified: Secondary | ICD-10-CM | POA: Diagnosis not present

## 2018-02-11 DIAGNOSIS — Z9981 Dependence on supplemental oxygen: Secondary | ICD-10-CM | POA: Diagnosis not present

## 2018-02-11 DIAGNOSIS — Z794 Long term (current) use of insulin: Secondary | ICD-10-CM | POA: Diagnosis not present

## 2018-02-11 DIAGNOSIS — Z942 Lung transplant status: Secondary | ICD-10-CM | POA: Diagnosis not present

## 2018-02-11 DIAGNOSIS — I2510 Atherosclerotic heart disease of native coronary artery without angina pectoris: Secondary | ICD-10-CM | POA: Diagnosis not present

## 2018-02-11 DIAGNOSIS — I129 Hypertensive chronic kidney disease with stage 1 through stage 4 chronic kidney disease, or unspecified chronic kidney disease: Secondary | ICD-10-CM | POA: Diagnosis not present

## 2018-02-11 DIAGNOSIS — R251 Tremor, unspecified: Secondary | ICD-10-CM | POA: Diagnosis not present

## 2018-02-11 DIAGNOSIS — E1122 Type 2 diabetes mellitus with diabetic chronic kidney disease: Secondary | ICD-10-CM | POA: Diagnosis not present

## 2018-02-11 DIAGNOSIS — T451X5A Adverse effect of antineoplastic and immunosuppressive drugs, initial encounter: Secondary | ICD-10-CM | POA: Diagnosis not present

## 2018-02-11 DIAGNOSIS — Z87891 Personal history of nicotine dependence: Secondary | ICD-10-CM | POA: Diagnosis not present

## 2018-02-11 DIAGNOSIS — R0902 Hypoxemia: Secondary | ICD-10-CM | POA: Diagnosis not present

## 2018-02-11 DIAGNOSIS — J8410 Pulmonary fibrosis, unspecified: Secondary | ICD-10-CM | POA: Diagnosis not present

## 2018-02-11 DIAGNOSIS — T86810 Lung transplant rejection: Secondary | ICD-10-CM | POA: Diagnosis not present

## 2018-02-11 DIAGNOSIS — R0789 Other chest pain: Secondary | ICD-10-CM | POA: Diagnosis not present

## 2018-02-11 DIAGNOSIS — E785 Hyperlipidemia, unspecified: Secondary | ICD-10-CM | POA: Diagnosis not present

## 2018-02-11 DIAGNOSIS — B9789 Other viral agents as the cause of diseases classified elsewhere: Secondary | ICD-10-CM | POA: Diagnosis not present

## 2018-02-11 DIAGNOSIS — T86811 Lung transplant failure: Secondary | ICD-10-CM | POA: Diagnosis not present

## 2018-02-11 DIAGNOSIS — J449 Chronic obstructive pulmonary disease, unspecified: Secondary | ICD-10-CM | POA: Diagnosis not present

## 2018-02-11 DIAGNOSIS — J309 Allergic rhinitis, unspecified: Secondary | ICD-10-CM | POA: Diagnosis not present

## 2018-02-11 DIAGNOSIS — J9621 Acute and chronic respiratory failure with hypoxia: Secondary | ICD-10-CM | POA: Diagnosis not present

## 2018-02-18 MED ORDER — DEXTROSE 50 % IV SOLN
12.50 | INTRAVENOUS | Status: DC
Start: ? — End: 2018-02-18

## 2018-02-18 MED ORDER — GENERIC EXTERNAL MEDICATION
2.50 | Status: DC
Start: 2018-02-17 — End: 2018-02-18

## 2018-02-18 MED ORDER — FLUTICASONE PROPIONATE 50 MCG/ACT NA SUSP
2.00 | NASAL | Status: DC
Start: 2018-02-17 — End: 2018-02-18

## 2018-02-18 MED ORDER — INSULIN REGULAR HUMAN 100 UNIT/ML IJ SOLN
.00 | INTRAMUSCULAR | Status: DC
Start: 2018-02-17 — End: 2018-02-18

## 2018-02-18 MED ORDER — GENERIC EXTERNAL MEDICATION
Status: DC
Start: ? — End: 2018-02-18

## 2018-02-18 MED ORDER — GLUCAGON HCL RDNA (DIAGNOSTIC) 1 MG IJ SOLR
1.00 | INTRAMUSCULAR | Status: DC
Start: ? — End: 2018-02-18

## 2018-02-18 MED ORDER — TACROLIMUS 1 MG PO CAPS
2.00 | ORAL_CAPSULE | ORAL | Status: DC
Start: 2018-02-18 — End: 2018-02-18

## 2018-02-18 MED ORDER — LIDOCAINE HCL (PF) 1 % IJ SOLN
3.00 | INTRAMUSCULAR | Status: DC
Start: ? — End: 2018-02-18

## 2018-02-18 MED ORDER — HEPARIN SODIUM (PORCINE) 5000 UNIT/ML IJ SOLN
5000.00 | INTRAMUSCULAR | Status: DC
Start: 2018-02-18 — End: 2018-02-18

## 2018-02-18 MED ORDER — INSULIN REGULAR HUMAN 100 UNIT/ML IJ SOLN
30.00 | INTRAMUSCULAR | Status: DC
Start: 2018-02-18 — End: 2018-02-18

## 2018-02-18 MED ORDER — DAPSONE 100 MG PO TABS
100.00 | ORAL_TABLET | ORAL | Status: DC
Start: 2018-02-18 — End: 2018-02-18

## 2018-02-18 MED ORDER — INSULIN REGULAR HUMAN 100 UNIT/ML IJ SOLN
20.00 | INTRAMUSCULAR | Status: DC
Start: 2018-02-18 — End: 2018-02-18

## 2018-02-18 MED ORDER — PRIMIDONE 50 MG PO TABS
50.00 | ORAL_TABLET | ORAL | Status: DC
Start: 2018-02-18 — End: 2018-02-18

## 2018-02-18 MED ORDER — GENERIC EXTERNAL MEDICATION
1000.00 | Status: DC
Start: 2018-02-18 — End: 2018-02-18

## 2018-02-18 MED ORDER — RENAL 1 MG PO CAPS
1.00 | ORAL_CAPSULE | ORAL | Status: DC
Start: 2018-02-18 — End: 2018-02-18

## 2018-02-18 MED ORDER — AZITHROMYCIN 250 MG PO TABS
250.00 | ORAL_TABLET | ORAL | Status: DC
Start: 2018-02-19 — End: 2018-02-18

## 2018-02-18 MED ORDER — ACETAMINOPHEN 325 MG PO TABS
650.00 | ORAL_TABLET | ORAL | Status: DC
Start: ? — End: 2018-02-18

## 2018-02-18 MED ORDER — VALGANCICLOVIR HCL 450 MG PO TABS
450.00 | ORAL_TABLET | ORAL | Status: DC
Start: 2018-02-18 — End: 2018-02-18

## 2018-02-18 MED ORDER — PRAVASTATIN SODIUM 20 MG PO TABS
20.00 | ORAL_TABLET | ORAL | Status: DC
Start: 2018-02-17 — End: 2018-02-18

## 2018-02-18 MED ORDER — AMLODIPINE BESYLATE 5 MG PO TABS
2.50 | ORAL_TABLET | ORAL | Status: DC
Start: 2018-02-17 — End: 2018-02-18

## 2018-02-18 MED ORDER — FAMOTIDINE 20 MG PO TABS
20.00 | ORAL_TABLET | ORAL | Status: DC
Start: 2018-02-18 — End: 2018-02-18

## 2018-02-18 MED ORDER — GENERIC EXTERNAL MEDICATION
800.00 | Status: DC
Start: 2018-02-18 — End: 2018-02-18

## 2018-02-18 MED ORDER — CALCIUM CARBONATE-VITAMIN D 600-400 MG-UNIT PO TABS
1.00 | ORAL_TABLET | ORAL | Status: DC
Start: 2018-02-18 — End: 2018-02-18

## 2018-02-18 MED ORDER — CARVEDILOL 12.5 MG PO TABS
12.50 | ORAL_TABLET | ORAL | Status: DC
Start: 2018-02-17 — End: 2018-02-18

## 2018-02-18 MED ORDER — FINASTERIDE 5 MG PO TABS
5.00 | ORAL_TABLET | ORAL | Status: DC
Start: 2018-02-18 — End: 2018-02-18

## 2018-02-18 MED ORDER — AMLODIPINE BESYLATE 5 MG PO TABS
5.00 | ORAL_TABLET | ORAL | Status: DC
Start: 2018-02-18 — End: 2018-02-18

## 2018-02-18 MED ORDER — INSULIN REGULAR HUMAN 100 UNIT/ML IJ SOLN
18.00 | INTRAMUSCULAR | Status: DC
Start: 2018-02-18 — End: 2018-02-18

## 2018-02-18 MED ORDER — PREDNISONE 5 MG PO TABS
2.50 | ORAL_TABLET | ORAL | Status: DC
Start: 2018-02-18 — End: 2018-02-18

## 2018-02-18 MED ORDER — MECLIZINE HCL 12.5 MG PO TABS
25.00 | ORAL_TABLET | ORAL | Status: DC
Start: ? — End: 2018-02-18

## 2018-02-25 DIAGNOSIS — Z942 Lung transplant status: Secondary | ICD-10-CM | POA: Diagnosis not present

## 2018-03-20 DIAGNOSIS — T86810 Lung transplant rejection: Secondary | ICD-10-CM | POA: Diagnosis not present

## 2018-03-20 DIAGNOSIS — J9601 Acute respiratory failure with hypoxia: Secondary | ICD-10-CM | POA: Diagnosis not present

## 2018-04-20 DIAGNOSIS — T86810 Lung transplant rejection: Secondary | ICD-10-CM | POA: Diagnosis not present

## 2018-04-20 DIAGNOSIS — J9601 Acute respiratory failure with hypoxia: Secondary | ICD-10-CM | POA: Diagnosis not present

## 2018-04-23 DIAGNOSIS — J309 Allergic rhinitis, unspecified: Secondary | ICD-10-CM | POA: Diagnosis not present

## 2018-04-23 DIAGNOSIS — Z79899 Other long term (current) drug therapy: Secondary | ICD-10-CM | POA: Diagnosis not present

## 2018-04-23 DIAGNOSIS — Z4824 Encounter for aftercare following lung transplant: Secondary | ICD-10-CM | POA: Diagnosis not present

## 2018-04-23 DIAGNOSIS — T86810 Lung transplant rejection: Secondary | ICD-10-CM | POA: Diagnosis not present

## 2018-04-23 DIAGNOSIS — R251 Tremor, unspecified: Secondary | ICD-10-CM | POA: Diagnosis not present

## 2018-04-23 DIAGNOSIS — T86818 Other complications of lung transplant: Secondary | ICD-10-CM | POA: Diagnosis not present

## 2018-04-23 DIAGNOSIS — E785 Hyperlipidemia, unspecified: Secondary | ICD-10-CM | POA: Diagnosis not present

## 2018-04-23 DIAGNOSIS — D899 Disorder involving the immune mechanism, unspecified: Secondary | ICD-10-CM | POA: Diagnosis not present

## 2018-04-23 DIAGNOSIS — Z942 Lung transplant status: Secondary | ICD-10-CM | POA: Diagnosis not present

## 2018-04-23 DIAGNOSIS — I10 Essential (primary) hypertension: Secondary | ICD-10-CM | POA: Diagnosis not present

## 2018-04-23 DIAGNOSIS — N183 Chronic kidney disease, stage 3 (moderate): Secondary | ICD-10-CM | POA: Diagnosis not present

## 2018-04-29 DIAGNOSIS — T86810 Lung transplant rejection: Secondary | ICD-10-CM | POA: Diagnosis not present

## 2018-04-29 DIAGNOSIS — Z942 Lung transplant status: Secondary | ICD-10-CM | POA: Diagnosis not present

## 2018-05-07 DIAGNOSIS — Z942 Lung transplant status: Secondary | ICD-10-CM | POA: Diagnosis not present

## 2018-05-07 DIAGNOSIS — Z48298 Encounter for aftercare following other organ transplant: Secondary | ICD-10-CM | POA: Diagnosis not present

## 2018-05-07 DIAGNOSIS — D899 Disorder involving the immune mechanism, unspecified: Secondary | ICD-10-CM | POA: Diagnosis not present

## 2018-05-10 DIAGNOSIS — Z79891 Long term (current) use of opiate analgesic: Secondary | ICD-10-CM | POA: Diagnosis not present

## 2018-05-10 DIAGNOSIS — N644 Mastodynia: Secondary | ICD-10-CM | POA: Diagnosis not present

## 2018-05-10 DIAGNOSIS — E1140 Type 2 diabetes mellitus with diabetic neuropathy, unspecified: Secondary | ICD-10-CM | POA: Diagnosis not present

## 2018-05-10 DIAGNOSIS — I10 Essential (primary) hypertension: Secondary | ICD-10-CM | POA: Diagnosis not present

## 2018-05-10 DIAGNOSIS — Z942 Lung transplant status: Secondary | ICD-10-CM | POA: Diagnosis not present

## 2018-05-19 DIAGNOSIS — T86810 Lung transplant rejection: Secondary | ICD-10-CM | POA: Diagnosis not present

## 2018-05-19 DIAGNOSIS — J9601 Acute respiratory failure with hypoxia: Secondary | ICD-10-CM | POA: Diagnosis not present

## 2018-06-19 DIAGNOSIS — T86810 Lung transplant rejection: Secondary | ICD-10-CM | POA: Diagnosis not present

## 2018-06-19 DIAGNOSIS — J9601 Acute respiratory failure with hypoxia: Secondary | ICD-10-CM | POA: Diagnosis not present

## 2018-07-14 DIAGNOSIS — I10 Essential (primary) hypertension: Secondary | ICD-10-CM | POA: Diagnosis not present

## 2018-07-14 DIAGNOSIS — E785 Hyperlipidemia, unspecified: Secondary | ICD-10-CM | POA: Diagnosis not present

## 2018-07-14 DIAGNOSIS — Z48298 Encounter for aftercare following other organ transplant: Secondary | ICD-10-CM | POA: Diagnosis not present

## 2018-07-14 DIAGNOSIS — Z4823 Encounter for aftercare following liver transplant: Secondary | ICD-10-CM | POA: Diagnosis not present

## 2018-07-14 DIAGNOSIS — D899 Disorder involving the immune mechanism, unspecified: Secondary | ICD-10-CM | POA: Diagnosis not present

## 2018-07-14 DIAGNOSIS — Z9981 Dependence on supplemental oxygen: Secondary | ICD-10-CM | POA: Diagnosis not present

## 2018-07-14 DIAGNOSIS — R0602 Shortness of breath: Secondary | ICD-10-CM | POA: Diagnosis not present

## 2018-07-14 DIAGNOSIS — J8410 Pulmonary fibrosis, unspecified: Secondary | ICD-10-CM | POA: Diagnosis not present

## 2018-07-14 DIAGNOSIS — T86810 Lung transplant rejection: Secondary | ICD-10-CM | POA: Diagnosis not present

## 2018-07-14 DIAGNOSIS — Z942 Lung transplant status: Secondary | ICD-10-CM | POA: Diagnosis not present

## 2018-07-14 DIAGNOSIS — N183 Chronic kidney disease, stage 3 (moderate): Secondary | ICD-10-CM | POA: Diagnosis not present

## 2018-07-14 DIAGNOSIS — I129 Hypertensive chronic kidney disease with stage 1 through stage 4 chronic kidney disease, or unspecified chronic kidney disease: Secondary | ICD-10-CM | POA: Diagnosis not present

## 2018-07-19 DIAGNOSIS — T86810 Lung transplant rejection: Secondary | ICD-10-CM | POA: Diagnosis not present

## 2018-07-19 DIAGNOSIS — J9601 Acute respiratory failure with hypoxia: Secondary | ICD-10-CM | POA: Diagnosis not present

## 2018-08-03 DIAGNOSIS — E7800 Pure hypercholesterolemia, unspecified: Secondary | ICD-10-CM | POA: Diagnosis not present

## 2018-08-03 DIAGNOSIS — I10 Essential (primary) hypertension: Secondary | ICD-10-CM | POA: Diagnosis not present

## 2018-08-03 DIAGNOSIS — Z79891 Long term (current) use of opiate analgesic: Secondary | ICD-10-CM | POA: Diagnosis not present

## 2018-08-03 DIAGNOSIS — E1140 Type 2 diabetes mellitus with diabetic neuropathy, unspecified: Secondary | ICD-10-CM | POA: Diagnosis not present

## 2018-08-10 DIAGNOSIS — I10 Essential (primary) hypertension: Secondary | ICD-10-CM | POA: Diagnosis not present

## 2018-08-10 DIAGNOSIS — Z942 Lung transplant status: Secondary | ICD-10-CM | POA: Diagnosis not present

## 2018-08-10 DIAGNOSIS — R5382 Chronic fatigue, unspecified: Secondary | ICD-10-CM | POA: Diagnosis not present

## 2018-08-10 DIAGNOSIS — N183 Chronic kidney disease, stage 3 (moderate): Secondary | ICD-10-CM | POA: Diagnosis not present

## 2018-08-10 DIAGNOSIS — E7800 Pure hypercholesterolemia, unspecified: Secondary | ICD-10-CM | POA: Diagnosis not present

## 2018-08-10 DIAGNOSIS — Z79891 Long term (current) use of opiate analgesic: Secondary | ICD-10-CM | POA: Diagnosis not present

## 2018-08-19 DIAGNOSIS — T86810 Lung transplant rejection: Secondary | ICD-10-CM | POA: Diagnosis not present

## 2018-08-19 DIAGNOSIS — J9601 Acute respiratory failure with hypoxia: Secondary | ICD-10-CM | POA: Diagnosis not present

## 2018-09-09 DIAGNOSIS — Z79891 Long term (current) use of opiate analgesic: Secondary | ICD-10-CM | POA: Diagnosis not present

## 2018-09-09 DIAGNOSIS — Z942 Lung transplant status: Secondary | ICD-10-CM | POA: Diagnosis not present

## 2018-09-09 DIAGNOSIS — N183 Chronic kidney disease, stage 3 (moderate): Secondary | ICD-10-CM | POA: Diagnosis not present

## 2018-09-09 DIAGNOSIS — I10 Essential (primary) hypertension: Secondary | ICD-10-CM | POA: Diagnosis not present

## 2018-09-09 DIAGNOSIS — E7800 Pure hypercholesterolemia, unspecified: Secondary | ICD-10-CM | POA: Diagnosis not present

## 2018-09-15 ENCOUNTER — Encounter: Payer: Self-pay | Admitting: *Deleted

## 2018-09-15 ENCOUNTER — Other Ambulatory Visit: Payer: Self-pay | Admitting: *Deleted

## 2018-09-15 DIAGNOSIS — I4891 Unspecified atrial fibrillation: Secondary | ICD-10-CM | POA: Insufficient documentation

## 2018-09-15 NOTE — Patient Outreach (Signed)
[employer] High Risk Referral. Spoke with Jorge [name] today and advised of [facility] Care Management program and benefits. He does not wish to participate at this time but would like to receive our information.  Will send him a successful letter and [facility] brochure.  Eulah Pont. Myrtie Neither, MSN, [facility] Gerontological Nurse Practitioner [facility] Care Management [phone]

## 2018-09-18 DIAGNOSIS — T86810 Lung transplant rejection: Secondary | ICD-10-CM | POA: Diagnosis not present

## 2018-09-18 DIAGNOSIS — J9601 Acute respiratory failure with hypoxia: Secondary | ICD-10-CM | POA: Diagnosis not present

## 2018-10-19 DIAGNOSIS — J9601 Acute respiratory failure with hypoxia: Secondary | ICD-10-CM | POA: Diagnosis not present

## 2018-10-19 DIAGNOSIS — T86810 Lung transplant rejection: Secondary | ICD-10-CM | POA: Diagnosis not present

## 2018-11-03 DIAGNOSIS — R739 Hyperglycemia, unspecified: Secondary | ICD-10-CM | POA: Diagnosis not present

## 2018-11-03 DIAGNOSIS — E785 Hyperlipidemia, unspecified: Secondary | ICD-10-CM | POA: Diagnosis not present

## 2018-11-03 DIAGNOSIS — I10 Essential (primary) hypertension: Secondary | ICD-10-CM | POA: Diagnosis not present

## 2018-11-12 DIAGNOSIS — E875 Hyperkalemia: Secondary | ICD-10-CM | POA: Diagnosis not present

## 2018-11-12 DIAGNOSIS — E7800 Pure hypercholesterolemia, unspecified: Secondary | ICD-10-CM | POA: Diagnosis not present

## 2018-11-12 DIAGNOSIS — D510 Vitamin B12 deficiency anemia due to intrinsic factor deficiency: Secondary | ICD-10-CM | POA: Diagnosis not present

## 2018-11-12 DIAGNOSIS — N183 Chronic kidney disease, stage 3 (moderate): Secondary | ICD-10-CM | POA: Diagnosis not present

## 2018-11-12 DIAGNOSIS — E119 Type 2 diabetes mellitus without complications: Secondary | ICD-10-CM | POA: Diagnosis not present

## 2018-11-12 DIAGNOSIS — Z23 Encounter for immunization: Secondary | ICD-10-CM | POA: Diagnosis not present

## 2018-11-19 DIAGNOSIS — J9601 Acute respiratory failure with hypoxia: Secondary | ICD-10-CM | POA: Diagnosis not present

## 2018-11-19 DIAGNOSIS — T86810 Lung transplant rejection: Secondary | ICD-10-CM | POA: Diagnosis not present

## 2018-11-24 DIAGNOSIS — Z5181 Encounter for therapeutic drug level monitoring: Secondary | ICD-10-CM | POA: Diagnosis not present

## 2018-11-24 DIAGNOSIS — N183 Chronic kidney disease, stage 3 (moderate): Secondary | ICD-10-CM | POA: Diagnosis not present

## 2018-11-24 DIAGNOSIS — Z4824 Encounter for aftercare following lung transplant: Secondary | ICD-10-CM | POA: Diagnosis not present

## 2018-11-24 DIAGNOSIS — I10 Essential (primary) hypertension: Secondary | ICD-10-CM | POA: Diagnosis not present

## 2018-11-24 DIAGNOSIS — Z942 Lung transplant status: Secondary | ICD-10-CM | POA: Diagnosis not present

## 2018-11-24 DIAGNOSIS — I129 Hypertensive chronic kidney disease with stage 1 through stage 4 chronic kidney disease, or unspecified chronic kidney disease: Secondary | ICD-10-CM | POA: Diagnosis not present

## 2018-11-24 DIAGNOSIS — Z7951 Long term (current) use of inhaled steroids: Secondary | ICD-10-CM | POA: Diagnosis not present

## 2018-11-24 DIAGNOSIS — E785 Hyperlipidemia, unspecified: Secondary | ICD-10-CM | POA: Diagnosis not present

## 2018-11-24 DIAGNOSIS — T86810 Lung transplant rejection: Secondary | ICD-10-CM | POA: Diagnosis not present

## 2018-11-24 DIAGNOSIS — Z9981 Dependence on supplemental oxygen: Secondary | ICD-10-CM | POA: Diagnosis not present

## 2018-11-24 DIAGNOSIS — J42 Unspecified chronic bronchitis: Secondary | ICD-10-CM | POA: Diagnosis not present

## 2018-11-24 DIAGNOSIS — T86818 Other complications of lung transplant: Secondary | ICD-10-CM | POA: Diagnosis not present

## 2018-11-24 DIAGNOSIS — Z8619 Personal history of other infectious and parasitic diseases: Secondary | ICD-10-CM | POA: Diagnosis not present

## 2018-11-24 DIAGNOSIS — Z79899 Other long term (current) drug therapy: Secondary | ICD-10-CM | POA: Diagnosis not present

## 2018-12-19 DIAGNOSIS — T86810 Lung transplant rejection: Secondary | ICD-10-CM | POA: Diagnosis not present

## 2018-12-19 DIAGNOSIS — J9601 Acute respiratory failure with hypoxia: Secondary | ICD-10-CM | POA: Diagnosis not present

## 2019-01-19 DIAGNOSIS — J9601 Acute respiratory failure with hypoxia: Secondary | ICD-10-CM | POA: Diagnosis not present

## 2019-01-19 DIAGNOSIS — T86810 Lung transplant rejection: Secondary | ICD-10-CM | POA: Diagnosis not present

## 2019-02-09 DIAGNOSIS — Z0000 Encounter for general adult medical examination without abnormal findings: Secondary | ICD-10-CM | POA: Diagnosis not present

## 2019-02-09 DIAGNOSIS — E119 Type 2 diabetes mellitus without complications: Secondary | ICD-10-CM | POA: Diagnosis not present

## 2019-02-09 DIAGNOSIS — D510 Vitamin B12 deficiency anemia due to intrinsic factor deficiency: Secondary | ICD-10-CM | POA: Diagnosis not present

## 2019-02-09 DIAGNOSIS — N183 Chronic kidney disease, stage 3 unspecified: Secondary | ICD-10-CM | POA: Diagnosis not present

## 2019-02-09 DIAGNOSIS — Z79891 Long term (current) use of opiate analgesic: Secondary | ICD-10-CM | POA: Diagnosis not present

## 2019-02-16 DIAGNOSIS — E1140 Type 2 diabetes mellitus with diabetic neuropathy, unspecified: Secondary | ICD-10-CM | POA: Diagnosis not present

## 2019-02-16 DIAGNOSIS — I10 Essential (primary) hypertension: Secondary | ICD-10-CM | POA: Diagnosis not present

## 2019-02-16 DIAGNOSIS — Z0000 Encounter for general adult medical examination without abnormal findings: Secondary | ICD-10-CM | POA: Diagnosis not present

## 2019-02-16 DIAGNOSIS — N183 Chronic kidney disease, stage 3 unspecified: Secondary | ICD-10-CM | POA: Diagnosis not present

## 2019-02-16 DIAGNOSIS — E785 Hyperlipidemia, unspecified: Secondary | ICD-10-CM | POA: Diagnosis not present

## 2019-02-18 DIAGNOSIS — J9601 Acute respiratory failure with hypoxia: Secondary | ICD-10-CM | POA: Diagnosis not present

## 2019-02-18 DIAGNOSIS — T86810 Lung transplant rejection: Secondary | ICD-10-CM | POA: Diagnosis not present

## 2019-03-21 DIAGNOSIS — J9601 Acute respiratory failure with hypoxia: Secondary | ICD-10-CM | POA: Diagnosis not present

## 2019-03-21 DIAGNOSIS — T86810 Lung transplant rejection: Secondary | ICD-10-CM | POA: Diagnosis not present

## 2019-03-23 DIAGNOSIS — I10 Essential (primary) hypertension: Secondary | ICD-10-CM | POA: Diagnosis not present

## 2019-03-23 DIAGNOSIS — E119 Type 2 diabetes mellitus without complications: Secondary | ICD-10-CM | POA: Diagnosis not present

## 2019-03-23 DIAGNOSIS — N1832 Chronic kidney disease, stage 3b: Secondary | ICD-10-CM | POA: Diagnosis not present

## 2019-03-23 DIAGNOSIS — T86810 Lung transplant rejection: Secondary | ICD-10-CM | POA: Diagnosis not present

## 2019-03-23 DIAGNOSIS — R9389 Abnormal findings on diagnostic imaging of other specified body structures: Secondary | ICD-10-CM | POA: Diagnosis not present

## 2019-03-23 DIAGNOSIS — Z9981 Dependence on supplemental oxygen: Secondary | ICD-10-CM | POA: Diagnosis not present

## 2019-03-23 DIAGNOSIS — Z942 Lung transplant status: Secondary | ICD-10-CM | POA: Diagnosis not present

## 2019-03-23 DIAGNOSIS — Z5181 Encounter for therapeutic drug level monitoring: Secondary | ICD-10-CM | POA: Diagnosis not present

## 2019-03-23 DIAGNOSIS — Z48298 Encounter for aftercare following other organ transplant: Secondary | ICD-10-CM | POA: Diagnosis not present

## 2019-03-23 DIAGNOSIS — Z794 Long term (current) use of insulin: Secondary | ICD-10-CM | POA: Diagnosis not present

## 2019-03-23 DIAGNOSIS — R0600 Dyspnea, unspecified: Secondary | ICD-10-CM | POA: Diagnosis not present

## 2019-03-23 DIAGNOSIS — E785 Hyperlipidemia, unspecified: Secondary | ICD-10-CM | POA: Diagnosis not present

## 2019-03-23 DIAGNOSIS — D849 Immunodeficiency, unspecified: Secondary | ICD-10-CM | POA: Diagnosis not present

## 2019-04-21 DIAGNOSIS — T86810 Lung transplant rejection: Secondary | ICD-10-CM | POA: Diagnosis not present

## 2019-04-21 DIAGNOSIS — J9601 Acute respiratory failure with hypoxia: Secondary | ICD-10-CM | POA: Diagnosis not present

## 2019-05-19 DIAGNOSIS — T86810 Lung transplant rejection: Secondary | ICD-10-CM | POA: Diagnosis not present

## 2019-05-19 DIAGNOSIS — J9601 Acute respiratory failure with hypoxia: Secondary | ICD-10-CM | POA: Diagnosis not present

## 2019-06-19 DIAGNOSIS — J9601 Acute respiratory failure with hypoxia: Secondary | ICD-10-CM | POA: Diagnosis not present

## 2019-06-19 DIAGNOSIS — T86810 Lung transplant rejection: Secondary | ICD-10-CM | POA: Diagnosis not present

## 2019-07-19 DIAGNOSIS — T86810 Lung transplant rejection: Secondary | ICD-10-CM | POA: Diagnosis not present

## 2019-07-19 DIAGNOSIS — J9601 Acute respiratory failure with hypoxia: Secondary | ICD-10-CM | POA: Diagnosis not present

## 2019-08-10 DIAGNOSIS — I10 Essential (primary) hypertension: Secondary | ICD-10-CM | POA: Diagnosis not present

## 2019-08-10 DIAGNOSIS — Z79891 Long term (current) use of opiate analgesic: Secondary | ICD-10-CM | POA: Diagnosis not present

## 2019-08-10 DIAGNOSIS — E1140 Type 2 diabetes mellitus with diabetic neuropathy, unspecified: Secondary | ICD-10-CM | POA: Diagnosis not present

## 2019-08-17 DIAGNOSIS — Z79891 Long term (current) use of opiate analgesic: Secondary | ICD-10-CM | POA: Diagnosis not present

## 2019-08-17 DIAGNOSIS — E875 Hyperkalemia: Secondary | ICD-10-CM | POA: Diagnosis not present

## 2019-08-17 DIAGNOSIS — D510 Vitamin B12 deficiency anemia due to intrinsic factor deficiency: Secondary | ICD-10-CM | POA: Diagnosis not present

## 2019-08-17 DIAGNOSIS — I10 Essential (primary) hypertension: Secondary | ICD-10-CM | POA: Diagnosis not present

## 2019-08-17 DIAGNOSIS — E119 Type 2 diabetes mellitus without complications: Secondary | ICD-10-CM | POA: Diagnosis not present

## 2019-08-19 DIAGNOSIS — J9601 Acute respiratory failure with hypoxia: Secondary | ICD-10-CM | POA: Diagnosis not present

## 2019-08-19 DIAGNOSIS — T86810 Lung transplant rejection: Secondary | ICD-10-CM | POA: Diagnosis not present

## 2019-09-16 DIAGNOSIS — Z7952 Long term (current) use of systemic steroids: Secondary | ICD-10-CM | POA: Diagnosis not present

## 2019-09-16 DIAGNOSIS — Z8619 Personal history of other infectious and parasitic diseases: Secondary | ICD-10-CM | POA: Diagnosis not present

## 2019-09-16 DIAGNOSIS — T86810 Lung transplant rejection: Secondary | ICD-10-CM | POA: Diagnosis not present

## 2019-09-16 DIAGNOSIS — Z9981 Dependence on supplemental oxygen: Secondary | ICD-10-CM | POA: Diagnosis not present

## 2019-09-16 DIAGNOSIS — Z20822 Contact with and (suspected) exposure to covid-19: Secondary | ICD-10-CM | POA: Diagnosis not present

## 2019-09-16 DIAGNOSIS — Z794 Long term (current) use of insulin: Secondary | ICD-10-CM | POA: Diagnosis not present

## 2019-09-16 DIAGNOSIS — Z800 Family history of malignant neoplasm of digestive organs: Secondary | ICD-10-CM | POA: Diagnosis not present

## 2019-09-16 DIAGNOSIS — Z942 Lung transplant status: Secondary | ICD-10-CM | POA: Diagnosis not present

## 2019-09-16 DIAGNOSIS — Z87891 Personal history of nicotine dependence: Secondary | ICD-10-CM | POA: Diagnosis not present

## 2019-09-16 DIAGNOSIS — E1122 Type 2 diabetes mellitus with diabetic chronic kidney disease: Secondary | ICD-10-CM | POA: Diagnosis not present

## 2019-09-16 DIAGNOSIS — E119 Type 2 diabetes mellitus without complications: Secondary | ICD-10-CM | POA: Diagnosis not present

## 2019-09-16 DIAGNOSIS — Z5181 Encounter for therapeutic drug level monitoring: Secondary | ICD-10-CM | POA: Diagnosis not present

## 2019-09-16 DIAGNOSIS — J9611 Chronic respiratory failure with hypoxia: Secondary | ICD-10-CM | POA: Diagnosis not present

## 2019-09-16 DIAGNOSIS — N1832 Chronic kidney disease, stage 3b: Secondary | ICD-10-CM | POA: Diagnosis not present

## 2019-09-16 DIAGNOSIS — I129 Hypertensive chronic kidney disease with stage 1 through stage 4 chronic kidney disease, or unspecified chronic kidney disease: Secondary | ICD-10-CM | POA: Diagnosis not present

## 2019-09-16 DIAGNOSIS — Z79891 Long term (current) use of opiate analgesic: Secondary | ICD-10-CM | POA: Diagnosis not present

## 2019-09-16 DIAGNOSIS — E785 Hyperlipidemia, unspecified: Secondary | ICD-10-CM | POA: Diagnosis not present

## 2019-09-16 DIAGNOSIS — T86818 Other complications of lung transplant: Secondary | ICD-10-CM | POA: Diagnosis not present

## 2019-09-16 DIAGNOSIS — N183 Chronic kidney disease, stage 3 unspecified: Secondary | ICD-10-CM | POA: Diagnosis not present

## 2019-09-16 DIAGNOSIS — J8410 Pulmonary fibrosis, unspecified: Secondary | ICD-10-CM | POA: Diagnosis not present

## 2019-09-16 DIAGNOSIS — Z9111 Patient's noncompliance with dietary regimen: Secondary | ICD-10-CM | POA: Diagnosis not present

## 2019-09-16 DIAGNOSIS — Z7951 Long term (current) use of inhaled steroids: Secondary | ICD-10-CM | POA: Diagnosis not present

## 2019-09-16 DIAGNOSIS — E875 Hyperkalemia: Secondary | ICD-10-CM | POA: Diagnosis not present

## 2019-09-16 DIAGNOSIS — Z79899 Other long term (current) drug therapy: Secondary | ICD-10-CM | POA: Diagnosis not present

## 2019-09-16 DIAGNOSIS — N1831 Chronic kidney disease, stage 3a: Secondary | ICD-10-CM | POA: Diagnosis not present

## 2019-09-16 DIAGNOSIS — J309 Allergic rhinitis, unspecified: Secondary | ICD-10-CM | POA: Diagnosis not present

## 2019-09-16 DIAGNOSIS — T451X5A Adverse effect of antineoplastic and immunosuppressive drugs, initial encounter: Secondary | ICD-10-CM | POA: Diagnosis not present

## 2019-09-18 DIAGNOSIS — T86810 Lung transplant rejection: Secondary | ICD-10-CM | POA: Diagnosis not present

## 2019-09-18 DIAGNOSIS — J9601 Acute respiratory failure with hypoxia: Secondary | ICD-10-CM | POA: Diagnosis not present

## 2019-09-20 DIAGNOSIS — J9601 Acute respiratory failure with hypoxia: Secondary | ICD-10-CM | POA: Diagnosis not present

## 2019-09-20 DIAGNOSIS — Z942 Lung transplant status: Secondary | ICD-10-CM | POA: Diagnosis not present

## 2019-10-19 DIAGNOSIS — T86810 Lung transplant rejection: Secondary | ICD-10-CM | POA: Diagnosis not present

## 2019-10-19 DIAGNOSIS — J9601 Acute respiratory failure with hypoxia: Secondary | ICD-10-CM | POA: Diagnosis not present

## 2019-11-17 DIAGNOSIS — E119 Type 2 diabetes mellitus without complications: Secondary | ICD-10-CM | POA: Diagnosis not present

## 2019-11-17 DIAGNOSIS — I10 Essential (primary) hypertension: Secondary | ICD-10-CM | POA: Diagnosis not present

## 2019-11-17 DIAGNOSIS — N183 Chronic kidney disease, stage 3 unspecified: Secondary | ICD-10-CM | POA: Diagnosis not present

## 2019-11-17 DIAGNOSIS — Z942 Lung transplant status: Secondary | ICD-10-CM | POA: Diagnosis not present

## 2019-11-17 DIAGNOSIS — Z79891 Long term (current) use of opiate analgesic: Secondary | ICD-10-CM | POA: Diagnosis not present

## 2019-11-19 DIAGNOSIS — J9601 Acute respiratory failure with hypoxia: Secondary | ICD-10-CM | POA: Diagnosis not present

## 2019-11-19 DIAGNOSIS — T86810 Lung transplant rejection: Secondary | ICD-10-CM | POA: Diagnosis not present

## 2019-12-19 DIAGNOSIS — J9601 Acute respiratory failure with hypoxia: Secondary | ICD-10-CM | POA: Diagnosis not present

## 2019-12-19 DIAGNOSIS — T86810 Lung transplant rejection: Secondary | ICD-10-CM | POA: Diagnosis not present

## 2020-01-19 DIAGNOSIS — T86810 Lung transplant rejection: Secondary | ICD-10-CM | POA: Diagnosis not present

## 2020-01-19 DIAGNOSIS — J9601 Acute respiratory failure with hypoxia: Secondary | ICD-10-CM | POA: Diagnosis not present

## 2020-02-14 DIAGNOSIS — I10 Essential (primary) hypertension: Secondary | ICD-10-CM | POA: Diagnosis not present

## 2020-02-14 DIAGNOSIS — E7800 Pure hypercholesterolemia, unspecified: Secondary | ICD-10-CM | POA: Diagnosis not present

## 2020-02-14 DIAGNOSIS — Z0000 Encounter for general adult medical examination without abnormal findings: Secondary | ICD-10-CM | POA: Diagnosis not present

## 2020-02-14 DIAGNOSIS — E119 Type 2 diabetes mellitus without complications: Secondary | ICD-10-CM | POA: Diagnosis not present

## 2020-02-17 DIAGNOSIS — T86818 Other complications of lung transplant: Secondary | ICD-10-CM | POA: Diagnosis not present

## 2020-02-17 DIAGNOSIS — N183 Chronic kidney disease, stage 3 unspecified: Secondary | ICD-10-CM | POA: Diagnosis not present

## 2020-02-17 DIAGNOSIS — Z9981 Dependence on supplemental oxygen: Secondary | ICD-10-CM | POA: Diagnosis not present

## 2020-02-17 DIAGNOSIS — E785 Hyperlipidemia, unspecified: Secondary | ICD-10-CM | POA: Diagnosis not present

## 2020-02-17 DIAGNOSIS — N1832 Chronic kidney disease, stage 3b: Secondary | ICD-10-CM | POA: Diagnosis not present

## 2020-02-17 DIAGNOSIS — D84821 Immunodeficiency due to drugs: Secondary | ICD-10-CM | POA: Diagnosis not present

## 2020-02-17 DIAGNOSIS — J42 Unspecified chronic bronchitis: Secondary | ICD-10-CM | POA: Diagnosis not present

## 2020-02-17 DIAGNOSIS — Z942 Lung transplant status: Secondary | ICD-10-CM | POA: Diagnosis not present

## 2020-02-17 DIAGNOSIS — E875 Hyperkalemia: Secondary | ICD-10-CM | POA: Diagnosis not present

## 2020-02-17 DIAGNOSIS — Z7952 Long term (current) use of systemic steroids: Secondary | ICD-10-CM | POA: Diagnosis not present

## 2020-02-17 DIAGNOSIS — I129 Hypertensive chronic kidney disease with stage 1 through stage 4 chronic kidney disease, or unspecified chronic kidney disease: Secondary | ICD-10-CM | POA: Diagnosis not present

## 2020-02-17 DIAGNOSIS — T86810 Lung transplant rejection: Secondary | ICD-10-CM | POA: Diagnosis not present

## 2020-02-17 DIAGNOSIS — K5900 Constipation, unspecified: Secondary | ICD-10-CM | POA: Diagnosis not present

## 2020-02-17 DIAGNOSIS — Z4824 Encounter for aftercare following lung transplant: Secondary | ICD-10-CM | POA: Diagnosis not present

## 2020-02-17 DIAGNOSIS — Z5181 Encounter for therapeutic drug level monitoring: Secondary | ICD-10-CM | POA: Diagnosis not present

## 2020-02-17 DIAGNOSIS — Z79899 Other long term (current) drug therapy: Secondary | ICD-10-CM | POA: Diagnosis not present

## 2020-02-18 DIAGNOSIS — J9601 Acute respiratory failure with hypoxia: Secondary | ICD-10-CM | POA: Diagnosis not present

## 2020-02-18 DIAGNOSIS — T86810 Lung transplant rejection: Secondary | ICD-10-CM | POA: Diagnosis not present

## 2020-02-22 DIAGNOSIS — E875 Hyperkalemia: Secondary | ICD-10-CM | POA: Diagnosis not present

## 2020-02-22 DIAGNOSIS — E7800 Pure hypercholesterolemia, unspecified: Secondary | ICD-10-CM | POA: Diagnosis not present

## 2020-02-22 DIAGNOSIS — I10 Essential (primary) hypertension: Secondary | ICD-10-CM | POA: Diagnosis not present

## 2020-02-22 DIAGNOSIS — R079 Chest pain, unspecified: Secondary | ICD-10-CM | POA: Diagnosis not present

## 2020-02-22 DIAGNOSIS — Z0000 Encounter for general adult medical examination without abnormal findings: Secondary | ICD-10-CM | POA: Diagnosis not present

## 2020-02-22 DIAGNOSIS — E119 Type 2 diabetes mellitus without complications: Secondary | ICD-10-CM | POA: Diagnosis not present

## 2020-03-20 DIAGNOSIS — T86810 Lung transplant rejection: Secondary | ICD-10-CM | POA: Diagnosis not present

## 2020-03-20 DIAGNOSIS — J9601 Acute respiratory failure with hypoxia: Secondary | ICD-10-CM | POA: Diagnosis not present

## 2020-04-17 DIAGNOSIS — E119 Type 2 diabetes mellitus without complications: Secondary | ICD-10-CM | POA: Diagnosis not present

## 2020-04-17 DIAGNOSIS — Z942 Lung transplant status: Secondary | ICD-10-CM | POA: Diagnosis not present

## 2020-04-17 DIAGNOSIS — E1140 Type 2 diabetes mellitus with diabetic neuropathy, unspecified: Secondary | ICD-10-CM | POA: Diagnosis not present

## 2020-04-20 DIAGNOSIS — J9601 Acute respiratory failure with hypoxia: Secondary | ICD-10-CM | POA: Diagnosis not present

## 2020-04-20 DIAGNOSIS — T86810 Lung transplant rejection: Secondary | ICD-10-CM | POA: Diagnosis not present

## 2020-04-26 DIAGNOSIS — Z942 Lung transplant status: Secondary | ICD-10-CM | POA: Diagnosis not present

## 2020-04-26 DIAGNOSIS — R52 Pain, unspecified: Secondary | ICD-10-CM | POA: Diagnosis not present

## 2020-05-08 DIAGNOSIS — Z942 Lung transplant status: Secondary | ICD-10-CM | POA: Diagnosis not present

## 2020-05-18 DIAGNOSIS — T86810 Lung transplant rejection: Secondary | ICD-10-CM | POA: Diagnosis not present

## 2020-05-18 DIAGNOSIS — J9601 Acute respiratory failure with hypoxia: Secondary | ICD-10-CM | POA: Diagnosis not present

## 2020-05-22 DIAGNOSIS — D649 Anemia, unspecified: Secondary | ICD-10-CM | POA: Diagnosis not present

## 2020-05-22 DIAGNOSIS — Z79899 Other long term (current) drug therapy: Secondary | ICD-10-CM | POA: Diagnosis not present

## 2020-05-22 DIAGNOSIS — Z942 Lung transplant status: Secondary | ICD-10-CM | POA: Diagnosis not present

## 2020-05-22 DIAGNOSIS — R0789 Other chest pain: Secondary | ICD-10-CM | POA: Diagnosis not present

## 2020-06-13 DIAGNOSIS — E162 Hypoglycemia, unspecified: Secondary | ICD-10-CM | POA: Diagnosis not present

## 2020-06-13 DIAGNOSIS — R410 Disorientation, unspecified: Secondary | ICD-10-CM | POA: Diagnosis not present

## 2020-06-13 DIAGNOSIS — Z743 Need for continuous supervision: Secondary | ICD-10-CM | POA: Diagnosis not present

## 2020-06-13 DIAGNOSIS — E161 Other hypoglycemia: Secondary | ICD-10-CM | POA: Diagnosis not present

## 2020-06-13 DIAGNOSIS — R0902 Hypoxemia: Secondary | ICD-10-CM | POA: Diagnosis not present

## 2020-06-18 DIAGNOSIS — D649 Anemia, unspecified: Secondary | ICD-10-CM | POA: Diagnosis not present

## 2020-06-18 DIAGNOSIS — Z79899 Other long term (current) drug therapy: Secondary | ICD-10-CM | POA: Diagnosis not present

## 2020-06-18 DIAGNOSIS — J9601 Acute respiratory failure with hypoxia: Secondary | ICD-10-CM | POA: Diagnosis not present

## 2020-06-18 DIAGNOSIS — T86810 Lung transplant rejection: Secondary | ICD-10-CM | POA: Diagnosis not present

## 2020-06-18 DIAGNOSIS — Z942 Lung transplant status: Secondary | ICD-10-CM | POA: Diagnosis not present

## 2020-06-21 DIAGNOSIS — N183 Chronic kidney disease, stage 3 unspecified: Secondary | ICD-10-CM | POA: Diagnosis not present

## 2020-06-21 DIAGNOSIS — Z79891 Long term (current) use of opiate analgesic: Secondary | ICD-10-CM | POA: Diagnosis not present

## 2020-06-21 DIAGNOSIS — E875 Hyperkalemia: Secondary | ICD-10-CM | POA: Diagnosis not present

## 2020-06-21 DIAGNOSIS — Z942 Lung transplant status: Secondary | ICD-10-CM | POA: Diagnosis not present

## 2020-06-21 DIAGNOSIS — E1140 Type 2 diabetes mellitus with diabetic neuropathy, unspecified: Secondary | ICD-10-CM | POA: Diagnosis not present

## 2020-06-29 DIAGNOSIS — E11649 Type 2 diabetes mellitus with hypoglycemia without coma: Secondary | ICD-10-CM | POA: Diagnosis not present

## 2020-06-29 DIAGNOSIS — Z4824 Encounter for aftercare following lung transplant: Secondary | ICD-10-CM | POA: Diagnosis not present

## 2020-06-29 DIAGNOSIS — J42 Unspecified chronic bronchitis: Secondary | ICD-10-CM | POA: Diagnosis not present

## 2020-06-29 DIAGNOSIS — N1832 Chronic kidney disease, stage 3b: Secondary | ICD-10-CM | POA: Diagnosis not present

## 2020-06-29 DIAGNOSIS — E1122 Type 2 diabetes mellitus with diabetic chronic kidney disease: Secondary | ICD-10-CM | POA: Diagnosis not present

## 2020-06-29 DIAGNOSIS — Z942 Lung transplant status: Secondary | ICD-10-CM | POA: Diagnosis not present

## 2020-06-29 DIAGNOSIS — E785 Hyperlipidemia, unspecified: Secondary | ICD-10-CM | POA: Diagnosis not present

## 2020-06-29 DIAGNOSIS — Z9981 Dependence on supplemental oxygen: Secondary | ICD-10-CM | POA: Diagnosis not present

## 2020-06-29 DIAGNOSIS — I10 Essential (primary) hypertension: Secondary | ICD-10-CM | POA: Diagnosis not present

## 2020-06-29 DIAGNOSIS — T86819 Unspecified complication of lung transplant: Secondary | ICD-10-CM | POA: Diagnosis not present

## 2020-06-29 DIAGNOSIS — N1831 Chronic kidney disease, stage 3a: Secondary | ICD-10-CM | POA: Diagnosis not present

## 2020-06-29 DIAGNOSIS — Z79899 Other long term (current) drug therapy: Secondary | ICD-10-CM | POA: Diagnosis not present

## 2020-06-29 DIAGNOSIS — E875 Hyperkalemia: Secondary | ICD-10-CM | POA: Diagnosis not present

## 2020-06-29 DIAGNOSIS — I129 Hypertensive chronic kidney disease with stage 1 through stage 4 chronic kidney disease, or unspecified chronic kidney disease: Secondary | ICD-10-CM | POA: Diagnosis not present

## 2020-06-29 DIAGNOSIS — D849 Immunodeficiency, unspecified: Secondary | ICD-10-CM | POA: Diagnosis not present

## 2020-06-29 DIAGNOSIS — Z5181 Encounter for therapeutic drug level monitoring: Secondary | ICD-10-CM | POA: Diagnosis not present

## 2020-07-18 DIAGNOSIS — T86810 Lung transplant rejection: Secondary | ICD-10-CM | POA: Diagnosis not present

## 2020-07-18 DIAGNOSIS — J9601 Acute respiratory failure with hypoxia: Secondary | ICD-10-CM | POA: Diagnosis not present

## 2020-07-24 DIAGNOSIS — Z942 Lung transplant status: Secondary | ICD-10-CM | POA: Diagnosis not present

## 2020-07-26 DIAGNOSIS — G894 Chronic pain syndrome: Secondary | ICD-10-CM | POA: Diagnosis not present

## 2020-07-26 DIAGNOSIS — E1140 Type 2 diabetes mellitus with diabetic neuropathy, unspecified: Secondary | ICD-10-CM | POA: Diagnosis not present

## 2020-07-26 DIAGNOSIS — Z79891 Long term (current) use of opiate analgesic: Secondary | ICD-10-CM | POA: Diagnosis not present

## 2020-07-26 DIAGNOSIS — E7800 Pure hypercholesterolemia, unspecified: Secondary | ICD-10-CM | POA: Diagnosis not present

## 2020-07-26 DIAGNOSIS — I10 Essential (primary) hypertension: Secondary | ICD-10-CM | POA: Diagnosis not present

## 2020-07-26 DIAGNOSIS — Z942 Lung transplant status: Secondary | ICD-10-CM | POA: Diagnosis not present

## 2020-07-26 DIAGNOSIS — N183 Chronic kidney disease, stage 3 unspecified: Secondary | ICD-10-CM | POA: Diagnosis not present

## 2020-08-18 DIAGNOSIS — T86810 Lung transplant rejection: Secondary | ICD-10-CM | POA: Diagnosis not present

## 2020-08-18 DIAGNOSIS — J9601 Acute respiratory failure with hypoxia: Secondary | ICD-10-CM | POA: Diagnosis not present

## 2020-08-28 DIAGNOSIS — Z942 Lung transplant status: Secondary | ICD-10-CM | POA: Diagnosis not present

## 2020-09-06 DIAGNOSIS — I10 Essential (primary) hypertension: Secondary | ICD-10-CM | POA: Diagnosis not present

## 2020-09-06 DIAGNOSIS — E875 Hyperkalemia: Secondary | ICD-10-CM | POA: Diagnosis not present

## 2020-09-06 DIAGNOSIS — N183 Chronic kidney disease, stage 3 unspecified: Secondary | ICD-10-CM | POA: Diagnosis not present

## 2020-09-06 DIAGNOSIS — D510 Vitamin B12 deficiency anemia due to intrinsic factor deficiency: Secondary | ICD-10-CM | POA: Diagnosis not present

## 2020-09-06 DIAGNOSIS — Z942 Lung transplant status: Secondary | ICD-10-CM | POA: Diagnosis not present

## 2020-09-17 DIAGNOSIS — T86810 Lung transplant rejection: Secondary | ICD-10-CM | POA: Diagnosis not present

## 2020-09-17 DIAGNOSIS — J9601 Acute respiratory failure with hypoxia: Secondary | ICD-10-CM | POA: Diagnosis not present

## 2020-09-20 DIAGNOSIS — D510 Vitamin B12 deficiency anemia due to intrinsic factor deficiency: Secondary | ICD-10-CM | POA: Diagnosis not present

## 2020-09-20 DIAGNOSIS — Z942 Lung transplant status: Secondary | ICD-10-CM | POA: Diagnosis not present

## 2020-09-20 DIAGNOSIS — E875 Hyperkalemia: Secondary | ICD-10-CM | POA: Diagnosis not present

## 2020-09-20 DIAGNOSIS — I10 Essential (primary) hypertension: Secondary | ICD-10-CM | POA: Diagnosis not present

## 2020-09-21 DIAGNOSIS — R0989 Other specified symptoms and signs involving the circulatory and respiratory systems: Secondary | ICD-10-CM | POA: Diagnosis not present

## 2020-09-21 DIAGNOSIS — I700 Atherosclerosis of aorta: Secondary | ICD-10-CM | POA: Diagnosis not present

## 2020-09-21 DIAGNOSIS — R051 Acute cough: Secondary | ICD-10-CM | POA: Diagnosis not present

## 2020-09-21 DIAGNOSIS — Z942 Lung transplant status: Secondary | ICD-10-CM | POA: Diagnosis not present

## 2020-09-21 DIAGNOSIS — R059 Cough, unspecified: Secondary | ICD-10-CM | POA: Diagnosis not present

## 2020-09-21 DIAGNOSIS — R918 Other nonspecific abnormal finding of lung field: Secondary | ICD-10-CM | POA: Diagnosis not present

## 2020-09-21 DIAGNOSIS — R0902 Hypoxemia: Secondary | ICD-10-CM | POA: Diagnosis not present

## 2020-10-04 DIAGNOSIS — Z87891 Personal history of nicotine dependence: Secondary | ICD-10-CM | POA: Diagnosis not present

## 2020-10-04 DIAGNOSIS — Z942 Lung transplant status: Secondary | ICD-10-CM | POA: Diagnosis not present

## 2020-10-04 DIAGNOSIS — Z79899 Other long term (current) drug therapy: Secondary | ICD-10-CM | POA: Diagnosis not present

## 2020-10-04 DIAGNOSIS — Z48298 Encounter for aftercare following other organ transplant: Secondary | ICD-10-CM | POA: Diagnosis not present

## 2020-10-04 DIAGNOSIS — T86819 Unspecified complication of lung transplant: Secondary | ICD-10-CM | POA: Diagnosis not present

## 2020-10-04 DIAGNOSIS — D84821 Immunodeficiency due to drugs: Secondary | ICD-10-CM | POA: Diagnosis not present

## 2020-10-04 DIAGNOSIS — D849 Immunodeficiency, unspecified: Secondary | ICD-10-CM | POA: Diagnosis not present

## 2020-10-16 DIAGNOSIS — Z942 Lung transplant status: Secondary | ICD-10-CM | POA: Diagnosis not present

## 2020-10-18 DIAGNOSIS — E1140 Type 2 diabetes mellitus with diabetic neuropathy, unspecified: Secondary | ICD-10-CM | POA: Diagnosis not present

## 2020-10-18 DIAGNOSIS — T86810 Lung transplant rejection: Secondary | ICD-10-CM | POA: Diagnosis not present

## 2020-10-18 DIAGNOSIS — N183 Chronic kidney disease, stage 3 unspecified: Secondary | ICD-10-CM | POA: Diagnosis not present

## 2020-10-18 DIAGNOSIS — J9601 Acute respiratory failure with hypoxia: Secondary | ICD-10-CM | POA: Diagnosis not present

## 2020-10-18 DIAGNOSIS — Z942 Lung transplant status: Secondary | ICD-10-CM | POA: Diagnosis not present

## 2020-10-18 DIAGNOSIS — E875 Hyperkalemia: Secondary | ICD-10-CM | POA: Diagnosis not present

## 2020-10-18 DIAGNOSIS — I10 Essential (primary) hypertension: Secondary | ICD-10-CM | POA: Diagnosis not present

## 2020-10-18 DIAGNOSIS — Z79891 Long term (current) use of opiate analgesic: Secondary | ICD-10-CM | POA: Diagnosis not present

## 2020-10-24 DIAGNOSIS — E875 Hyperkalemia: Secondary | ICD-10-CM | POA: Diagnosis not present

## 2020-11-15 DIAGNOSIS — E875 Hyperkalemia: Secondary | ICD-10-CM | POA: Diagnosis not present

## 2020-11-15 DIAGNOSIS — E1140 Type 2 diabetes mellitus with diabetic neuropathy, unspecified: Secondary | ICD-10-CM | POA: Diagnosis not present

## 2020-11-15 DIAGNOSIS — R5383 Other fatigue: Secondary | ICD-10-CM | POA: Diagnosis not present

## 2020-11-15 DIAGNOSIS — E7800 Pure hypercholesterolemia, unspecified: Secondary | ICD-10-CM | POA: Diagnosis not present

## 2020-11-15 DIAGNOSIS — Z79891 Long term (current) use of opiate analgesic: Secondary | ICD-10-CM | POA: Diagnosis not present

## 2020-11-18 DIAGNOSIS — J9601 Acute respiratory failure with hypoxia: Secondary | ICD-10-CM | POA: Diagnosis not present

## 2020-11-18 DIAGNOSIS — T86810 Lung transplant rejection: Secondary | ICD-10-CM | POA: Diagnosis not present

## 2020-11-22 DIAGNOSIS — N183 Chronic kidney disease, stage 3 unspecified: Secondary | ICD-10-CM | POA: Diagnosis not present

## 2020-11-22 DIAGNOSIS — E1140 Type 2 diabetes mellitus with diabetic neuropathy, unspecified: Secondary | ICD-10-CM | POA: Diagnosis not present

## 2020-11-22 DIAGNOSIS — G894 Chronic pain syndrome: Secondary | ICD-10-CM | POA: Diagnosis not present

## 2020-11-22 DIAGNOSIS — I10 Essential (primary) hypertension: Secondary | ICD-10-CM | POA: Diagnosis not present

## 2020-11-22 DIAGNOSIS — E875 Hyperkalemia: Secondary | ICD-10-CM | POA: Diagnosis not present

## 2020-11-22 DIAGNOSIS — E7800 Pure hypercholesterolemia, unspecified: Secondary | ICD-10-CM | POA: Diagnosis not present

## 2020-11-22 DIAGNOSIS — Z942 Lung transplant status: Secondary | ICD-10-CM | POA: Diagnosis not present

## 2020-11-27 DIAGNOSIS — R1312 Dysphagia, oropharyngeal phase: Secondary | ICD-10-CM | POA: Diagnosis not present

## 2020-12-18 DIAGNOSIS — T86810 Lung transplant rejection: Secondary | ICD-10-CM | POA: Diagnosis not present

## 2020-12-18 DIAGNOSIS — J9601 Acute respiratory failure with hypoxia: Secondary | ICD-10-CM | POA: Diagnosis not present

## 2020-12-21 DIAGNOSIS — I129 Hypertensive chronic kidney disease with stage 1 through stage 4 chronic kidney disease, or unspecified chronic kidney disease: Secondary | ICD-10-CM | POA: Diagnosis not present

## 2020-12-21 DIAGNOSIS — N1831 Chronic kidney disease, stage 3a: Secondary | ICD-10-CM | POA: Diagnosis not present

## 2020-12-21 DIAGNOSIS — D849 Immunodeficiency, unspecified: Secondary | ICD-10-CM | POA: Diagnosis not present

## 2020-12-21 DIAGNOSIS — T86810 Lung transplant rejection: Secondary | ICD-10-CM | POA: Diagnosis not present

## 2020-12-21 DIAGNOSIS — T86819 Unspecified complication of lung transplant: Secondary | ICD-10-CM | POA: Diagnosis not present

## 2020-12-21 DIAGNOSIS — N1832 Chronic kidney disease, stage 3b: Secondary | ICD-10-CM | POA: Diagnosis not present

## 2020-12-21 DIAGNOSIS — E1122 Type 2 diabetes mellitus with diabetic chronic kidney disease: Secondary | ICD-10-CM | POA: Diagnosis not present

## 2020-12-21 DIAGNOSIS — Z9981 Dependence on supplemental oxygen: Secondary | ICD-10-CM | POA: Diagnosis not present

## 2020-12-21 DIAGNOSIS — Z23 Encounter for immunization: Secondary | ICD-10-CM | POA: Diagnosis not present

## 2020-12-21 DIAGNOSIS — Z5181 Encounter for therapeutic drug level monitoring: Secondary | ICD-10-CM | POA: Diagnosis not present

## 2020-12-21 DIAGNOSIS — Z79899 Other long term (current) drug therapy: Secondary | ICD-10-CM | POA: Diagnosis not present

## 2020-12-21 DIAGNOSIS — Z942 Lung transplant status: Secondary | ICD-10-CM | POA: Diagnosis not present

## 2020-12-21 DIAGNOSIS — I10 Essential (primary) hypertension: Secondary | ICD-10-CM | POA: Diagnosis not present

## 2020-12-21 DIAGNOSIS — E785 Hyperlipidemia, unspecified: Secondary | ICD-10-CM | POA: Diagnosis not present

## 2020-12-21 DIAGNOSIS — J42 Unspecified chronic bronchitis: Secondary | ICD-10-CM | POA: Diagnosis not present

## 2020-12-21 DIAGNOSIS — E875 Hyperkalemia: Secondary | ICD-10-CM | POA: Diagnosis not present

## 2020-12-21 DIAGNOSIS — T86818 Other complications of lung transplant: Secondary | ICD-10-CM | POA: Diagnosis not present

## 2020-12-27 DIAGNOSIS — Z942 Lung transplant status: Secondary | ICD-10-CM | POA: Diagnosis not present

## 2020-12-27 DIAGNOSIS — E1140 Type 2 diabetes mellitus with diabetic neuropathy, unspecified: Secondary | ICD-10-CM | POA: Diagnosis not present

## 2020-12-27 DIAGNOSIS — E875 Hyperkalemia: Secondary | ICD-10-CM | POA: Diagnosis not present

## 2021-01-03 DIAGNOSIS — R5383 Other fatigue: Secondary | ICD-10-CM | POA: Diagnosis not present

## 2021-01-03 DIAGNOSIS — E875 Hyperkalemia: Secondary | ICD-10-CM | POA: Diagnosis not present

## 2021-01-03 DIAGNOSIS — Z942 Lung transplant status: Secondary | ICD-10-CM | POA: Diagnosis not present

## 2021-01-03 DIAGNOSIS — E1140 Type 2 diabetes mellitus with diabetic neuropathy, unspecified: Secondary | ICD-10-CM | POA: Diagnosis not present

## 2021-01-03 DIAGNOSIS — E7800 Pure hypercholesterolemia, unspecified: Secondary | ICD-10-CM | POA: Diagnosis not present

## 2021-01-18 DIAGNOSIS — J9601 Acute respiratory failure with hypoxia: Secondary | ICD-10-CM | POA: Diagnosis not present

## 2021-01-18 DIAGNOSIS — T86810 Lung transplant rejection: Secondary | ICD-10-CM | POA: Diagnosis not present

## 2021-01-28 DIAGNOSIS — D510 Vitamin B12 deficiency anemia due to intrinsic factor deficiency: Secondary | ICD-10-CM | POA: Diagnosis not present

## 2021-01-28 DIAGNOSIS — R5383 Other fatigue: Secondary | ICD-10-CM | POA: Diagnosis not present

## 2021-01-28 DIAGNOSIS — E559 Vitamin D deficiency, unspecified: Secondary | ICD-10-CM | POA: Diagnosis not present

## 2021-01-28 DIAGNOSIS — E1140 Type 2 diabetes mellitus with diabetic neuropathy, unspecified: Secondary | ICD-10-CM | POA: Diagnosis not present

## 2021-01-28 DIAGNOSIS — Z942 Lung transplant status: Secondary | ICD-10-CM | POA: Diagnosis not present

## 2021-01-30 DIAGNOSIS — R5383 Other fatigue: Secondary | ICD-10-CM | POA: Diagnosis not present

## 2021-01-30 DIAGNOSIS — E1140 Type 2 diabetes mellitus with diabetic neuropathy, unspecified: Secondary | ICD-10-CM | POA: Diagnosis not present

## 2021-02-04 DIAGNOSIS — R5383 Other fatigue: Secondary | ICD-10-CM | POA: Diagnosis not present

## 2021-02-04 DIAGNOSIS — Z942 Lung transplant status: Secondary | ICD-10-CM | POA: Diagnosis not present

## 2021-02-04 DIAGNOSIS — E7800 Pure hypercholesterolemia, unspecified: Secondary | ICD-10-CM | POA: Diagnosis not present

## 2021-02-04 DIAGNOSIS — G250 Essential tremor: Secondary | ICD-10-CM | POA: Diagnosis not present

## 2021-02-04 DIAGNOSIS — E875 Hyperkalemia: Secondary | ICD-10-CM | POA: Diagnosis not present

## 2021-02-04 DIAGNOSIS — E1140 Type 2 diabetes mellitus with diabetic neuropathy, unspecified: Secondary | ICD-10-CM | POA: Diagnosis not present

## 2021-02-17 DIAGNOSIS — T86810 Lung transplant rejection: Secondary | ICD-10-CM | POA: Diagnosis not present

## 2021-02-17 DIAGNOSIS — J9601 Acute respiratory failure with hypoxia: Secondary | ICD-10-CM | POA: Diagnosis not present

## 2021-03-19 ENCOUNTER — Emergency Department (HOSPITAL_BASED_OUTPATIENT_CLINIC_OR_DEPARTMENT_OTHER): Payer: Medicare Other

## 2021-03-19 ENCOUNTER — Encounter (HOSPITAL_BASED_OUTPATIENT_CLINIC_OR_DEPARTMENT_OTHER): Payer: Self-pay

## 2021-03-19 ENCOUNTER — Emergency Department (HOSPITAL_BASED_OUTPATIENT_CLINIC_OR_DEPARTMENT_OTHER)
Admission: EM | Admit: 2021-03-19 | Discharge: 2021-03-19 | Disposition: A | Discharge status: Home / Self Care | Payer: Medicare Other | Attending: Emergency Medicine | Admitting: Emergency Medicine

## 2021-03-19 ENCOUNTER — Other Ambulatory Visit: Payer: Self-pay

## 2021-03-19 DIAGNOSIS — E1122 Type 2 diabetes mellitus with diabetic chronic kidney disease: Secondary | ICD-10-CM | POA: Insufficient documentation

## 2021-03-19 DIAGNOSIS — Z79899 Other long term (current) drug therapy: Secondary | ICD-10-CM | POA: Diagnosis not present

## 2021-03-19 DIAGNOSIS — N183 Chronic kidney disease, stage 3 unspecified: Secondary | ICD-10-CM | POA: Insufficient documentation

## 2021-03-19 DIAGNOSIS — W010XXA Fall on same level from slipping, tripping and stumbling without subsequent striking against object, initial encounter: Secondary | ICD-10-CM | POA: Insufficient documentation

## 2021-03-19 DIAGNOSIS — M545 Low back pain, unspecified: Secondary | ICD-10-CM | POA: Diagnosis not present

## 2021-03-19 DIAGNOSIS — S299XXA Unspecified injury of thorax, initial encounter: Secondary | ICD-10-CM | POA: Diagnosis present

## 2021-03-19 DIAGNOSIS — S22088A Other fracture of T11-T12 vertebra, initial encounter for closed fracture: Secondary | ICD-10-CM

## 2021-03-19 DIAGNOSIS — Z794 Long term (current) use of insulin: Secondary | ICD-10-CM | POA: Insufficient documentation

## 2021-03-19 DIAGNOSIS — W19XXXA Unspecified fall, initial encounter: Secondary | ICD-10-CM

## 2021-03-19 DIAGNOSIS — S22089A Unspecified fracture of T11-T12 vertebra, initial encounter for closed fracture: Secondary | ICD-10-CM | POA: Insufficient documentation

## 2021-03-19 DIAGNOSIS — I129 Hypertensive chronic kidney disease with stage 1 through stage 4 chronic kidney disease, or unspecified chronic kidney disease: Secondary | ICD-10-CM | POA: Insufficient documentation

## 2021-03-19 MED ORDER — HYDROCODONE-ACETAMINOPHEN 5-325 MG PO TABS: 2.0000 | ORAL_TABLET | Freq: Four times a day (QID) | ORAL | 0 refills | Status: DC | PRN

## 2021-03-19 MED ORDER — HYDROCODONE-ACETAMINOPHEN 5-325 MG PO TABS
2.0000 | ORAL_TABLET | Freq: Once | ORAL | Status: AC
  Administered 2021-03-19: 2 via ORAL
  Filled 2021-03-19: qty 2

## 2021-03-19 MED ORDER — HYDROCODONE-ACETAMINOPHEN 5-325 MG PO TABS: 2.0000 | ORAL_TABLET | Freq: Four times a day (QID) | ORAL | 0 refills | Status: AC | PRN

## 2021-03-19 NOTE — ED Notes (Signed)
RT Note: Pt.arrived [facility] on 4 lpm n/c per home setting via battery operated [place] made staff aware, this RT provided/placed pt. on full E cylinder @ 4 lpm while waiting for Triage/Room for s/p fall at home. Family member remains @ bedside, Triage RN made aware, RT to monitor. Oxygen tank timer set.

## 2021-03-19 NOTE — ED Triage Notes (Signed)
Patient here POV from Home with Significant Other with Back Pain.  Patient tripped over Oxygen Concentrator on Friday. Patient attempted to Treat Pain at Home but Pain has only worsened.   No Head Injury. No Blood Thinning Medications.  NAD Noted during Triage. A&Ox4. GCS 15. BIB Wheelchair.

## 2021-03-19 NOTE — ED Provider Notes (Signed)
[facility] Provider Note   CSN: [id number] Arrival date & time: 03/19/21  1316     History No chief complaint on file.   Jorge [name] is a 81 y.o. male.  Patient is with past medical history of diabetes type 2, hypertension, pulmonary fibrosis, atrial fibrillation, CKD stage III, status post lung transplant.  He presents the emergency department after a fall that he had on Friday.  He says that he tripped over his oxygen tank and fell backwards straight onto his back.  He has been trying to manage his symptoms at home and has had extreme difficulty with this.  He states that his pain is only worsened.  He denies any shooting pains down his legs, leg numbness or weakness, bowel or bladder dysfunction that is unusual for him, difficulty moving his lower extremities.  HPI     Home Medications Prior to Admission medications   Medication Sig Start Date End Date Taking? Authorizing Provider  acetaminophen (TYLENOL) 500 MG tablet Take 500 mg by mouth every 4 (four) hours as needed. For pain     [provider]  amLODipine (NORVASC) 10 MG tablet Take 10 mg by mouth daily.    [provider]  b complex-vitamin c-folic acid (NEPHRO-VITE) 0.8 MG TABS Take 0.8 mg by mouth daily.    [provider]  Calcium 500-125 MG-UNIT TABS Take 1 tablet by mouth 2 (two) times daily.      [provider]  dapsone 100 MG tablet Take 100 mg by mouth daily.    [provider]  divalproex (DEPAKOTE) 250 MG DR tablet Take 250 mg by mouth daily.  02/03/11   [provider]  furosemide (LASIX) 20 MG tablet Take 20 mg by mouth 2 (two) times a week. TAKES Monday AND Friday ONLY    [provider]  HYDROcodone-acetaminophen (NORCO/VICODIN) 5-325 MG tablet Take 2 tablets by mouth every 6 (six) hours as needed for up to 5 days for moderate pain or severe pain. 03/19/21 03/24/21  Winson Eichorn, Adora Fridge, PA-C  insulin aspart (NOVOLOG)  100 UNIT/ML injection Inject 15 Units into the skin 3 (three) times daily before meals. Take as directed per sliding scale    [provider]  insulin detemir (LEVEMIR) 100 UNIT/ML injection Inject 20 Units into the skin at bedtime.    [provider]  magnesium oxide (MAG-OX) 400 MG tablet Take 1,200 mg by mouth 3 (three) times daily.      [provider]  meclizine (ANTIVERT) 25 MG tablet Take 25 mg by mouth 2 (two) times daily. For dizziness 01/20/11   [provider]  methocarbamol (ROBAXIN) 500 MG tablet Take 1 tablet (500 mg total) 2 (two) times daily by mouth. 01/17/17   Shary Decamp, PA-C  mycophenolate (CELLCEPT) 500 MG tablet Take 1,000 mg by mouth 2 (two) times daily.      [provider]  nystatin (MYCOSTATIN) 100000 UNIT/ML suspension Take 5 mLs by mouth 4 (four) times daily.      [provider]  predniSONE (DELTASONE) 5 MG tablet Take 10 mg by mouth daily.      [provider]  ranitidine (ZANTAC) 150 MG capsule Take 300 mg by mouth 2 (two) times daily.      [provider]  sodium polystyrene (KAYEXALATE) 15 GM/60ML suspension Take 30 g by mouth once a week. On saturdays    [provider]  tacrolimus (PROGRAF) 1 MG capsule Take 0.5 mg by  mouth See admin instructions. Take 0.5 mg every morning and on tuesdays and thursdays take an additional 0.5 mg in the evening    [provider]  voriconazole (VFEND) 200 MG tablet Take 200 mg by mouth 2 (two) times daily.    [provider]      Allergies    Aspirin and Other    Review of Systems   Review of Systems  Musculoskeletal:  Positive for back pain.  All other systems reviewed and are negative.  Physical Exam Updated Vital Signs BP (!) 164/75 (BP Location: Left Arm)    Pulse 88    Temp 98 F (36.7 C) (Temporal)    Resp 18    Ht 5\' 7"  (1.702 m)    Wt 86.2 kg    SpO2 98%    BMI 29.76 kg/m  Physical Exam Vitals and nursing note  reviewed.  Constitutional:      General: He is not in acute distress.    Appearance: Normal appearance. He is well-developed. He is not ill-appearing, toxic-appearing or diaphoretic.  HENT:     Head: Normocephalic and atraumatic.     Nose: No nasal deformity.     Mouth/Throat:     Lips: Pink. No lesions.  Eyes:     General: Gaze aligned appropriately. No scleral icterus.       Right eye: No discharge.        Left eye: No discharge.     Conjunctiva/sclera: Conjunctivae normal.     Right eye: Right conjunctiva is not injected. No exudate or hemorrhage.    Left eye: Left conjunctiva is not injected. No exudate or hemorrhage. Pulmonary:     Effort: Pulmonary effort is normal. No respiratory distress.  Musculoskeletal:     Comments: No C-spine TTP or step-offs noted.  Patient does have pretty significant T10-L4 midline TTP.  There is not any reproducible paraspinal tenderness.  Patient with full range of motion of lower extremities with 5 out of 5 strength bilateral.  There is no sensation deficit in lower extremities.  Pedal pulses and tibial dorsalis pulses are 2+ bilaterally.  Skin:    General: Skin is warm and dry.  Neurological:     Mental Status: He is alert and oriented to person, place, and time.  Psychiatric:        Mood and Affect: Mood normal.        Speech: Speech normal.        Behavior: Behavior normal. Behavior is cooperative.    ED Results / Procedures / Treatments   Labs (all labs ordered are listed, but only abnormal results are displayed) Labs Reviewed - No data to display  EKG None  Radiology CT Thoracic Spine Wo Contrast  Result Date: 03/19/2021 CLINICAL DATA:  Back trauma, no prior imaging (Age >= 16y) EXAM: CT THORACIC AND LUMBAR SPINE WITHOUT CONTRAST TECHNIQUE: Multidetector CT imaging of the thoracic and lumbar spine was performed without contrast. Multiplanar CT image reconstructions were also generated. COMPARISON:  CT abdomen pelvis 01/17/2017, chest  CT 07/25/2009, two-view chest radiograph 09/21/2020 FINDINGS: CT THORACIC SPINE FINDINGS Alignment: Normal Vertebrae: There is an acute anterior compression deformity of T11 with approximately 20% height loss (series 6, image 38). There is also an acute nondisplaced fracture of the left transverse process of T11 which appears to extend into the left lamina. There is mild bulging of the posterior cortex without retropulsion. No significant canal stenosis. No aggressive osseous lesion. Paraspinal and other soft tissues: There is  a large gallstone in the gallbladder measuring up to 2.0 cm. 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 Coronary artery calcifications. Disc levels: There are multilevel degenerative changes of the thoracic spine. CT LUMBAR SPINE FINDINGS Segmentation: 5 lumbar type vertebrae. Alignment: Normal Vertebrae: Unchanged anterior wedging of T12. Unchanged superior endplate depression of L1 related to a chronic Schmorl's node. There is no evidence of acute lumbar spine fracture. Paraspinal and other soft tissues: Negative Disc levels: There is multilevel degenerative disc disease, with moderate disc height loss at L1-L2 and L5-S1. There is posterior disc bulging most notable at L3-L4, L4-5, and L5-S1 with endplate spurring. There is bilateral facet arthropathy, severe at L5-S1 with bony spurring. There are varying degrees of mild to moderate spinal canal and neural foraminal stenosis. IMPRESSION: CT THORACIC SPINE IMPRESSION Acute anterior compression fracture of T11 with approximately 20% height loss. Additional fracture of the left transverse process of T11 which appears to extend into the left lamina. Mild bulging of the posterior cortex without significant retropulsion. No significant canal stenosis at this level. Chronic anterior wedging of T12. Incidentally noted fibrotic interstitial lung disease involving the  right lung. CT LUMBAR SPINE IMPRESSION No acute lumbar spine fracture. Chronic Schmorl's node of the superior endplate of L1. Multilevel degenerative disc disease with disc bulging and facet arthropathy with bony spurring resulting in varying degrees of mild-to-moderate spinal canal or neural foraminal stenosis. Incidentally noted cholelithiasis. Electronically Signed   By: Maurine Simmering M.D.   On: 03/19/2021 16:42   CT Lumbar Spine Wo Contrast  Result Date: 03/19/2021 CLINICAL DATA:  Back trauma, no prior imaging (Age >= 16y) EXAM: CT THORACIC AND LUMBAR SPINE WITHOUT CONTRAST TECHNIQUE: Multidetector CT imaging of the thoracic and lumbar spine was performed without contrast. Multiplanar CT image reconstructions were also generated. COMPARISON:  CT abdomen pelvis 01/17/2017, chest CT 07/25/2009, two-view chest radiograph 09/21/2020 FINDINGS: CT THORACIC SPINE FINDINGS Alignment: Normal Vertebrae: There is an acute anterior compression deformity of T11 with approximately 20% height loss (series 6, image 38). There is also an acute nondisplaced fracture of the left transverse process of T11 which appears to extend into the left lamina. There is mild bulging of the posterior cortex without retropulsion. No significant canal stenosis. No aggressive osseous lesion. Paraspinal and other soft tissues: There is a large gallstone in the gallbladder measuring up to 2.0 cm. 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 Coronary artery calcifications. Disc levels: There are multilevel degenerative changes of the thoracic spine. CT LUMBAR SPINE FINDINGS Segmentation: 5 lumbar type vertebrae. Alignment: Normal Vertebrae: Unchanged anterior wedging of T12. Unchanged superior endplate depression of L1 related to a chronic Schmorl's node. There is no evidence of acute lumbar spine fracture. Paraspinal and other soft tissues: Negative Disc levels:  There is multilevel degenerative disc disease, with moderate disc height loss at L1-L2 and L5-S1. There is posterior disc bulging most notable at L3-L4, L4-5, and L5-S1 with endplate spurring. There is bilateral facet arthropathy, severe at L5-S1 with bony spurring. There are varying degrees of mild to moderate spinal canal and neural foraminal stenosis. IMPRESSION: CT THORACIC SPINE IMPRESSION Acute anterior compression fracture of T11 with approximately 20% height loss. Additional fracture of the left transverse process of T11 which appears to extend into the left lamina. Mild bulging of the posterior cortex without significant retropulsion. No significant canal stenosis at this level. Chronic anterior wedging of  T12. 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 CT LUMBAR SPINE IMPRESSION No acute lumbar spine fracture. Chronic Schmorl's node of the superior endplate of L1. Multilevel degenerative disc disease with disc bulging and facet arthropathy with bony spurring resulting in varying degrees of mild-to-moderate spinal canal or neural foraminal stenosis. Incidentally noted cholelithiasis. Electronically Signed   By: Maurine Simmering M.D.   On: 03/19/2021 16:42    Procedures Procedures   Medications Ordered in ED Medications  HYDROcodone-acetaminophen (NORCO/VICODIN) 5-325 MG per tablet 2 tablet (2 tablets Oral Given 03/19/21 1525)    ED Course/ Medical Decision Making/ A&P                           Medical Decision Making Amount and/or Complexity of Data Reviewed Radiology: ordered and independent interpretation performed. Decision-making details documented in ED Course.  Risk OTC drugs. Prescription drug management. Parenteral controlled substances.   This is a 81 y.o. male with a PMH of diabetes type 2, hypertension, pulmonary fibrosis, atrial fibrillation, CKD stage III, status post lung transplant who presents to the ED for mechanical fall that occurred 4  days ago.  He has had significant lower and mid back pain ever since then.  He presents with no red flag symptoms of traumatic back pain.  He has no radicular symptoms.  He did not hit his head at the time of injury had no loss of consciousness.  He has no other complaints other than the back pain.  Given patient's age, and worsening of pain, will proceed with CT imaging of thoracic and lumbar spine to assess for fractures.  I personally reviewed all laboratory work and imaging. Abnormal results outlined below. CT was notable for acute anterior compression fracture of the T11 with approximately 1% height loss.  There is an additional left transverse process T11 fracture that extends into the left lamina.  There is mild bulging of the posterior cortex without significant retropulsion.  There is no significant stenosis.  Given this happened multiple days ago and patient has no red flag or neurological symptoms.  He is okay to be discharged home with neurosurgery follow-up.  I will increase his Norco to 2 pills a day instead of 1.  He needs to follow-up with his PCP for further management of his pain.  I have seen and evaluated this patient in conjunction with my attending physician who agrees and has made changes to the plan accordingly.  Portions of this note were generated with [employer]. Dictation errors may occur despite best attempts at proofreading.  Final Clinical Impression(s) / ED Diagnoses Final diagnoses:  Other closed fracture of eleventh thoracic vertebra, initial encounter ([place])  Fall, initial encounter    Rx / DC Orders ED Discharge Orders          Ordered    HYDROcodone-acetaminophen (NORCO/VICODIN) 5-325 MG tablet  Every 6 hours PRN,   Status:  Discontinued        03/19/21 1718    HYDROcodone-acetaminophen (NORCO/VICODIN) 5-325 MG tablet  Every 6 hours PRN,   Status:  Discontinued        03/19/21 1746    HYDROcodone-acetaminophen (NORCO/VICODIN) 5-325 MG  tablet  Every 6 hours PRN        03/19/21 1746              Meleni Delahunt, Adora Fridge, PA-C 03/19/21 2018    Lajean Saver, MD 03/19/21 2128

## 2021-03-19 NOTE — Discharge Instructions (Addendum)
You have a fracture of your T11 vertebrate. I have provided you with a referral to neurosurgery. The doctor's name is Emelda Brothers. Please call them first thing tomorrow and tell them that you were referred to them with a T11 fracture.  I have increased your Norco prescription to taking two pills every 6 hours. Please call your PCP or the provider who manages your pain meds to have them further adjust your medications.

## 2021-03-19 NOTE — ED Notes (Signed)
Patient transported to CT 

## 2021-03-26 ENCOUNTER — Other Ambulatory Visit: Payer: Self-pay | Admitting: Neurological Surgery

## 2021-03-26 DIAGNOSIS — S22009D Unspecified fracture of unspecified thoracic vertebra, subsequent encounter for fracture with routine healing: Secondary | ICD-10-CM

## 2021-04-04 ENCOUNTER — Other Ambulatory Visit (HOSPITAL_COMMUNITY): Payer: Self-pay | Admitting: Interventional Radiology

## 2021-04-04 ENCOUNTER — Ambulatory Visit
Admission: RE | Admit: 2021-04-04 | Discharge: 2021-04-04 | Disposition: A | Discharge status: Home / Self Care | Payer: Medicare Other | Source: Ambulatory Visit | Attending: Neurological Surgery | Admitting: Neurological Surgery

## 2021-04-04 ENCOUNTER — Other Ambulatory Visit: Payer: Self-pay

## 2021-04-04 DIAGNOSIS — S22009D Unspecified fracture of unspecified thoracic vertebra, subsequent encounter for fracture with routine healing: Secondary | ICD-10-CM

## 2021-04-04 DIAGNOSIS — S22080S Wedge compression fracture of T11-T12 vertebra, sequela: Secondary | ICD-10-CM
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
## 2021-04-10 ENCOUNTER — Other Ambulatory Visit (HOSPITAL_COMMUNITY): Payer: Self-pay | Admitting: Interventional Radiology

## 2021-04-10 DIAGNOSIS — M545 Low back pain, unspecified: Secondary | ICD-10-CM

## 2021-04-11 ENCOUNTER — Ambulatory Visit (HOSPITAL_COMMUNITY)
Admission: RE | Admit: 2021-04-11 | Discharge: 2021-04-11 | Disposition: A | Discharge status: Home / Self Care | Payer: Medicare Other | Source: Ambulatory Visit | Attending: Interventional Radiology | Admitting: Interventional Radiology

## 2021-04-11 ENCOUNTER — Other Ambulatory Visit: Payer: Self-pay

## 2021-04-11 DIAGNOSIS — M545 Low back pain, unspecified: Secondary | ICD-10-CM | POA: Insufficient documentation

## 2021-04-16 ENCOUNTER — Other Ambulatory Visit (HOSPITAL_COMMUNITY): Payer: Self-pay | Admitting: Interventional Radiology

## 2021-04-16 ENCOUNTER — Telehealth (HOSPITAL_COMMUNITY): Payer: Self-pay

## 2021-04-16 DIAGNOSIS — S22080S Wedge compression fracture of T11-T12 vertebra, sequela: Secondary | ICD-10-CM

## 2021-04-16 NOTE — Telephone Encounter (Signed)
-----   Message from Tyson Alias, NP sent at 04/15/2021  4:07 PM EST ----- Regarding: RE: MRI results Hello Caryl Pina, Dev said he can do KP of T11. He said make sure he is not on blood thinners and that he can remain still for procedure. I know this has to be approved first. Dev said if he is in a lot if pain, this will help him. Thanks,  Manuela Schwartz   ----- Message ----- From: Danielle Dess Sent: 04/15/2021   2:54 PM EST To: Tyson Alias, NP Subject: MRI results                                    Manuela Schwartz,   Can you have Deveshwar review Mr. Hinners recent MR that he had done to see if he can treat his back?  Thanks,  Lia Foyer

## 2021-04-17 ENCOUNTER — Other Ambulatory Visit: Payer: Self-pay | Admitting: Radiology

## 2021-04-18 ENCOUNTER — Other Ambulatory Visit (HOSPITAL_COMMUNITY): Payer: Self-pay | Admitting: Interventional Radiology

## 2021-04-18 ENCOUNTER — Other Ambulatory Visit: Payer: Self-pay

## 2021-04-18 ENCOUNTER — Ambulatory Visit (HOSPITAL_COMMUNITY)
Admission: RE | Admit: 2021-04-18 | Discharge: 2021-04-18 | Disposition: A | Discharge status: Home / Self Care | Payer: Medicare Other | Source: Ambulatory Visit | Attending: Interventional Radiology | Admitting: Interventional Radiology

## 2021-04-18 DIAGNOSIS — Z942 Lung transplant status: Secondary | ICD-10-CM | POA: Diagnosis not present

## 2021-04-18 DIAGNOSIS — E119 Type 2 diabetes mellitus without complications: Secondary | ICD-10-CM | POA: Insufficient documentation

## 2021-04-18 DIAGNOSIS — Z794 Long term (current) use of insulin: Secondary | ICD-10-CM | POA: Diagnosis not present

## 2021-04-18 DIAGNOSIS — Z87891 Personal history of nicotine dependence: Secondary | ICD-10-CM | POA: Diagnosis not present

## 2021-04-18 DIAGNOSIS — M4854XA Collapsed vertebra, not elsewhere classified, thoracic region, initial encounter for fracture: Secondary | ICD-10-CM | POA: Diagnosis not present

## 2021-04-18 DIAGNOSIS — J84112 Idiopathic pulmonary fibrosis: Secondary | ICD-10-CM | POA: Insufficient documentation

## 2021-04-18 DIAGNOSIS — W19XXXA Unspecified fall, initial encounter: Secondary | ICD-10-CM | POA: Diagnosis not present

## 2021-04-18 DIAGNOSIS — S22080S Wedge compression fracture of T11-T12 vertebra, sequela: Secondary | ICD-10-CM

## 2021-04-18 DIAGNOSIS — I10 Essential (primary) hypertension: Secondary | ICD-10-CM | POA: Diagnosis not present

## 2021-04-18 HISTORY — PX: IR KYPHO THORACIC WITH BONE BIOPSY: IMG5518

## 2021-04-18 LAB — CBC WITH DIFFERENTIAL/PLATELET
Abs Immature Granulocytes: 0.02 10*3/uL (ref 0.00–0.07)
Basophils Absolute: 0 10*3/uL (ref 0.0–0.1)
Basophils Relative: 1 %
Eosinophils Absolute: 0.1 10*3/uL (ref 0.0–0.5)
Eosinophils Relative: 2 %
HCT: 27.6 % — ABNORMAL LOW (ref 39.0–52.0)
Hemoglobin: 8.5 g/dL — ABNORMAL LOW (ref 13.0–17.0)
Immature Granulocytes: 0 %
Lymphocytes Relative: 24 %
Lymphs Abs: 1.6 10*3/uL (ref 0.7–4.0)
MCH: 31.1 pg (ref 26.0–34.0)
MCHC: 30.8 g/dL (ref 30.0–36.0)
MCV: 101.1 fL — ABNORMAL HIGH (ref 80.0–100.0)
Monocytes Absolute: 0.5 10*3/uL (ref 0.1–1.0)
Monocytes Relative: 8 %
Neutro Abs: 4.3 10*3/uL (ref 1.7–7.7)
Neutrophils Relative %: 65 %
Platelets: 161 10*3/uL (ref 150–400)
RBC: 2.73 MIL/uL — ABNORMAL LOW (ref 4.22–5.81)
RDW: 12.7 % (ref 11.5–15.5)
WBC: 6.7 10*3/uL (ref 4.0–10.5)
nRBC: 0 % (ref 0.0–0.2)

## 2021-04-18 LAB — BASIC METABOLIC PANEL
Anion gap: 6 (ref 5–15)
BUN: 22 mg/dL (ref 8–23)
CO2: 29 mmol/L (ref 22–32)
Calcium: 9 mg/dL (ref 8.9–10.3)
Chloride: 103 mmol/L (ref 98–111)
Creatinine, Ser: 1.4 mg/dL — ABNORMAL HIGH (ref 0.61–1.24)
GFR, Estimated: 51 mL/min — ABNORMAL LOW (ref >60)
Glucose, Bld: 99 mg/dL (ref 70–99)
Potassium: 5.2 mmol/L — ABNORMAL HIGH (ref 3.5–5.1)
Sodium: 138 mmol/L (ref 135–145)

## 2021-04-18 LAB — GLUCOSE, CAPILLARY: Glucose-Capillary: 102 mg/dL — ABNORMAL HIGH (ref 70–99)

## 2021-04-18 LAB — PROTIME-INR
INR: 1 (ref 0.8–1.2)
Prothrombin Time: 13.2 seconds (ref 11.4–15.2)

## 2021-04-18 MED ORDER — HYDROCODONE-ACETAMINOPHEN 5-325 MG PO TABS
1.0000 | ORAL_TABLET | Freq: Once | ORAL | Status: AC
  Administered 2021-04-18: 1 via ORAL
  Filled 2021-04-18: qty 1

## 2021-04-18 MED ORDER — MIDAZOLAM HCL 2 MG/2ML IJ SOLN
INTRAMUSCULAR | Status: AC
  Filled 2021-04-18: qty 4

## 2021-04-18 MED ORDER — FENTANYL CITRATE (PF) 100 MCG/2ML IJ SOLN
INTRAMUSCULAR | Status: AC | PRN
  Administered 2021-04-18 (×2): 25 ug via INTRAVENOUS
  Administered 2021-04-18: 50 ug via INTRAVENOUS
  Administered 2021-04-18: 25 ug via INTRAVENOUS

## 2021-04-18 MED ORDER — SODIUM CHLORIDE 0.9 % IV SOLN: INTRAVENOUS | Status: DC

## 2021-04-18 MED ORDER — CEFAZOLIN SODIUM-DEXTROSE 2-4 GM/100ML-% IV SOLN: 2.0000 g | Freq: Once | INTRAVENOUS | Status: AC

## 2021-04-18 MED ORDER — FENTANYL CITRATE (PF) 100 MCG/2ML IJ SOLN
INTRAMUSCULAR | Status: AC
  Filled 2021-04-18: qty 4

## 2021-04-18 MED ORDER — SODIUM CHLORIDE 0.9 % IV SOLN: INTRAVENOUS | Status: AC

## 2021-04-18 MED ORDER — BUPIVACAINE HCL (PF) 0.5 % IJ SOLN
INTRAMUSCULAR | Status: AC
  Filled 2021-04-18: qty 30

## 2021-04-18 MED ORDER — IOHEXOL 300 MG/ML  SOLN
100.0000 mL | Freq: Once | INTRAMUSCULAR | Status: AC | PRN
  Administered 2021-04-18: 10 mL

## 2021-04-18 MED ORDER — MIDAZOLAM HCL 2 MG/2ML IJ SOLN
INTRAMUSCULAR | Status: AC | PRN
  Administered 2021-04-18 (×2): .5 mg via INTRAVENOUS
  Administered 2021-04-18: 1 mg via INTRAVENOUS
  Administered 2021-04-18: .5 mg via INTRAVENOUS

## 2021-04-18 MED ORDER — CEFAZOLIN SODIUM-DEXTROSE 2-4 GM/100ML-% IV SOLN
INTRAVENOUS | Status: AC
  Administered 2021-04-18: 2 g via INTRAVENOUS
  Filled 2021-04-18: qty 100

## 2021-04-18 MED ORDER — TOBRAMYCIN SULFATE 1.2 G IJ SOLR
INTRAMUSCULAR | Status: AC
  Filled 2021-04-18: qty 1.2

## 2021-04-18 NOTE — Procedures (Signed)
INR. S/P T 11 Balloon KP .  S.Sahana Boyland MD

## 2021-04-18 NOTE — Discharge Instructions (Signed)
No stooping,or bending or lifting more than 10 lbs for 2 weeks. 2.No driving for 2 weeks  Use walker to ambulate for 2 weeks. RTC PRN 2 weeks

## 2021-04-18 NOTE — Progress Notes (Signed)
Pt ambulated without difficulty or bleeding.   Discharged home with wife who will drive and stay with pt x 24 hrs 

## 2021-04-18 NOTE — H&P (Signed)
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 16y) EXAM: CT THORACIC AND LUMBAR SPINE WITHOUT CONTRAST TECHNIQUE: Multidetector CT imaging of the thoracic and lumbar spine was performed without contrast. Multiplanar CT image reconstructions were also generated. COMPARISON: CT abdomen pelvis 01/17/2017, chest CT 07/25/2009, two-view chest radiograph 09/21/2020 FINDINGS: CT THORACIC SPINE FINDINGS Alignment: Normal Vertebrae: There is an acute anterior  compression deformity of T11 with approximately 20% height loss (series 6, image 38). There is also an acute nondisplaced fracture of the left transverse process of T11 which appears to extend into the left lamina. There is mild bulging of the posterior cortex without retropulsion. No significant canal stenosis. No aggressive osseous lesion. Paraspinal and other soft tissues: There is a large gallstone in the gallbladder measuring up to 2.0 cm. 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 Coronary artery calcifications. Disc levels: There are multilevel degenerative changes of the thoracic spine. CT LUMBAR SPINE FINDINGS Segmentation: 5 lumbar type vertebrae. Alignment: Normal Vertebrae: Unchanged anterior wedging of T12. Unchanged superior endplate depression of L1 related to a chronic Schmorl's node. There is no evidence of acute lumbar spine fracture. Paraspinal and other soft tissues: Negative Disc levels: There is multilevel degenerative disc disease, with moderate disc height loss at L1-L2 and L5-S1. There is posterior disc bulging most notable at L3-L4, L4-5, and L5-S1 with endplate spurring. There is bilateral facet arthropathy, severe at L5-S1 with bony spurring. There are varying degrees of mild to moderate spinal canal and neural foraminal stenosis. IMPRESSION: CT THORACIC SPINE IMPRESSION Acute anterior compression fracture of T11 with approximately 20% height loss. Additional fracture of the left transverse process of T11 which appears to extend into the left lamina. Mild bulging of the posterior cortex without significant retropulsion. No significant canal stenosis at this level. Chronic anterior wedging of T12. 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 CT LUMBAR SPINE IMPRESSION No acute lumbar spine fracture. Chronic Schmorl's node of the superior endplate of L1. Multilevel  degenerative disc disease with disc bulging and facet arthropathy with bony spurring resulting in varying degrees of mild-to-moderate spinal canal or neural foraminal stenosis. Incidentally noted cholelithiasis. Electronically Signed By: Maurine Simmering M.D. On: 03/19/2021 16:42 CT Lumbar Spine Wo Contrast Result Date: 1/10/2023CLINICAL DATA: Back trauma, no prior imaging (Age >= 16y) EXAM: CT THORACIC AND LUMBAR SPINE WITHOUT CONTRAST TECHNIQUE: Multidetector CT imaging of the thoracic and lumbar spine was performed without contrast. Multiplanar CT image reconstructions were also generated. COMPARISON: CT abdomen pelvis 01/17/2017, chest CT 07/25/2009, two-view chest radiograph 09/21/2020 FINDINGS: CT THORACIC SPINE FINDINGS Alignment: Normal Vertebrae: There is an acute anterior compression deformity of T11 with approximately 20% height loss (series 6, image 38). There is also an acute nondisplaced fracture of the left transverse process of T11 which appears to extend into the left lamina. There is mild bulging of the posterior cortex without retropulsion. No significant canal stenosis.  No aggressive osseous lesion. Paraspinal and other soft tissues: There is a large gallstone in the gallbladder measuring up to 2.0 cm. 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 Coronary artery calcifications. Disc levels: There are multilevel degenerative changes of the thoracic spine. CT LUMBAR SPINE FINDINGS Segmentation: 5 lumbar type vertebrae. Alignment: Normal Vertebrae: Unchanged anterior wedging of T12. Unchanged superior endplate depression of L1 related to a chronic Schmorl's node. There is no evidence of acute lumbar spine fracture. Paraspinal and other soft tissues: Negative Disc levels: There is multilevel degenerative disc disease, with moderate disc height loss at L1-L2 and L5-S1. There is posterior disc bulging most notable at L3-L4,  L4-5, and L5-S1 with endplate spurring. There is bilateral facet arthropathy, severe at L5-S1 with bony spurring. There are varying degrees of mild to moderate spinal canal and neural foraminal stenosis. IMPRESSION: CT THORACIC SPINE IMPRESSION Acute anterior compression fracture of T11 with approximately 20% height loss. Additional fracture of the left transverse process of T11 which appears to extend into the left lamina. Mild bulging of the posterior cortex without significant retropulsion. No significant canal stenosis at this level. Chronic anterior wedging of T12. 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 CT LUMBAR SPINE IMPRESSION No acute lumbar spine fracture. Chronic Schmorl's node of the superior endplate of L1. Multilevel degenerative disc disease with disc bulging and facet arthropathy with bony spurring resulting in varying degrees of mild-to-moderate spinal canal or neural foraminal stenosis. Incidentally noted cholelithiasis. Electronically Signed By: Maurine Simmering M.D. On: 03/19/2021 16:42 Labs:CBC: No results for input(s): WBC, HGB, HCT, PLT in the last 8760 hours. COAGS: No results for input(s): INR, APTT in the last 8760 hours. BMP: No results for input(s): NA, K, CL, CO2, GLUCOSE, BUN, CALCIUM, CREATININE, GFRNONAA, GFRAA in the last 8760 hours. Invalid input(s): CMP LIVER FUNCTION TESTS: No results for input(s): BILITOT, AST, ALT, ALKPHOS, PROT, ALBUMIN in the last 8760 hours. TUMOR MARKERS: No results for input(s): AFPTM, CEA, CA199, CHROMGRNA in the last 8760 hours. Assessment and Plan:My impression is that this patient has an unhealed subacute T11 compression fracture which likely contribute or account for most of the low back pain. Based on cross-sectional imaging, this would be anatomically approachable for percutaneous intervention. No associated spinal stenosis or other contraindications. No suggestion of metastatic disease or other pathologic findings  to indicate a need for concomitant core biopsy.Given the lack of adequate symptom relief with time and a fairly aggressive pain medication regimen, and limitations of activities of daily living, the patient is clinically an appropriate candidate for consideration of vertebral augmentation.I discussed with the patient and his son the pathophysiology of vertebral compression fracture deformities; the stable nature of these which does not require emergent treatment; natural history which includes healing over some unpredictable number of months. We discussed treatment options including watchful waiting, surgical fixation, and percutaneous kyphoplasty/vertebroplasty. We discussed in detail the percutaneous kyphoplasty technique, anticipated benefits, time course to symptom resolution, possible risks and side effects. We discussed his elevated risk of additional level fractures with or without vertebral augmentation. We discussed the long-term need for continued bone building therapy managed by the patient's PCP. It is recommended that patients aged 12 years or older be evaluated for possible testing or treatment of osteoporosis. They seemed to understand, and did ask appropriate questions.The patient is motivated to proceed with treatment ASAP. Accordingly, we schedule percutaneous thoracic T11 kyphoplasty under moderate sedation at [facility] or [facility] as an outpatient at the patient's convenience, pending carrier approval if needed.Thank you for this interesting consult. I greatly enjoyed meeting Jorge [name] and look forward to participating in their care. A copy of this report was sent to the requesting provider on this date. I spent a total of 30 Minutes in face to face in clinical consultation, greater than 50% of which was counseling/coordinating care for painful subacute unhealed T11 compression fracture . Electronically Signed   By: Lucrezia Europe M.D.   On: 04/08/2021 09:29     Labs:  CBC: Recent Labs    04/18/21 0915  WBC 6.7  HGB 8.5*  HCT 27.6*  PLT 161    COAGS: Recent Labs    04/18/21 0915  INR 1.0    BMP: Recent Labs    04/18/21 0915  NA 138  K 5.2*  CL 103  CO2 29  GLUCOSE 99  BUN 22  CALCIUM 9.0  CREATININE 1.40*  GFRNONAA 51*    LIVER FUNCTION TESTS: No results for input(s): BILITOT, AST, ALT, ALKPHOS, PROT, ALBUMIN in the last 8760 hours.  TUMOR MARKERS: No results for input(s): AFPTM, CEA, CA199, CHROMGRNA in the last 8760 hours.  Assessment and Plan:  T11 Compression Fracture: Jorge [name], 81 year old male, presents today to the [facility] for an image-guided T11 kyphoplasty/vertebroplasty.   Risks and benefits of T11 kyphoplasty/vertebroplasty were discussed with the patient including, but not limited to education regarding the natural healing process of compression fractures without intervention, bleeding, infection, cement migration which may cause spinal cord damage, paralysis, pulmonary embolism or even death.  This interventional procedure involves the use of X-rays and because of the nature of the planned procedure, it is possible that we will have prolonged use of X-ray fluoroscopy. Potential radiation risks to you include (but are not limited to) the following: - A slightly elevated risk for cancer  several years later in life. This risk is typically less than 0.5% percent. This risk is low in comparison to the normal incidence of human cancer, which is 33% for women and 50% for men according to the [place]. - Radiation induced injury can include skin redness, resembling a rash, tissue breakdown / ulcers and hair loss (which can be temporary or permanent).  The likelihood of either of these occurring depends on the difficulty of the procedure and whether you are sensitive to radiation due to previous procedures, disease, or genetic conditions.   IF your procedure  requires a prolonged use of radiation, you will be notified and given written instructions for further action.  It is your responsibility to monitor the irradiated area for the 2 weeks following the procedure and to notify your physician if you are concerned that you have suffered a radiation induced injury.    All of the patient's questions were answered, patient is agreeable to proceed. He has been NPO.   Consent signed and in chart.   Thank you for this interesting consult.  I greatly enjoyed meeting Jorge [name] and look forward to participating in their care.  A copy of this report was sent to the requesting provider on this date.  Electronically Signed: Soyla Dryer, AGACNP-BC [phone] 04/18/2021, 10:11 AM   I spent a total of  30 Minutes   in face to face in clinical consultation, greater than 50% of which was counseling/coordinating care for T11 kyphoplasty/vertebroplasty

## 2021-08-18 DIAGNOSIS — T86810 Lung transplant rejection: Secondary | ICD-10-CM | POA: Diagnosis not present

## 2021-08-18 DIAGNOSIS — J9601 Acute respiratory failure with hypoxia: Secondary | ICD-10-CM | POA: Diagnosis not present

## 2021-08-21 DIAGNOSIS — E7800 Pure hypercholesterolemia, unspecified: Secondary | ICD-10-CM | POA: Diagnosis not present

## 2021-08-21 DIAGNOSIS — Z79891 Long term (current) use of opiate analgesic: Secondary | ICD-10-CM | POA: Diagnosis not present

## 2021-08-21 DIAGNOSIS — R634 Abnormal weight loss: Secondary | ICD-10-CM | POA: Diagnosis not present

## 2021-08-21 DIAGNOSIS — R5382 Chronic fatigue, unspecified: Secondary | ICD-10-CM | POA: Diagnosis not present

## 2021-08-21 DIAGNOSIS — D649 Anemia, unspecified: Secondary | ICD-10-CM | POA: Diagnosis not present

## 2021-08-21 DIAGNOSIS — E1140 Type 2 diabetes mellitus with diabetic neuropathy, unspecified: Secondary | ICD-10-CM | POA: Diagnosis not present

## 2021-08-21 DIAGNOSIS — I10 Essential (primary) hypertension: Secondary | ICD-10-CM | POA: Diagnosis not present

## 2021-08-21 DIAGNOSIS — Z942 Lung transplant status: Secondary | ICD-10-CM | POA: Diagnosis not present

## 2021-08-21 DIAGNOSIS — N1831 Chronic kidney disease, stage 3a: Secondary | ICD-10-CM | POA: Diagnosis not present

## 2021-09-17 DIAGNOSIS — T86810 Lung transplant rejection: Secondary | ICD-10-CM | POA: Diagnosis not present

## 2021-09-17 DIAGNOSIS — J9601 Acute respiratory failure with hypoxia: Secondary | ICD-10-CM | POA: Diagnosis not present

## 2021-10-16 DIAGNOSIS — I10 Essential (primary) hypertension: Secondary | ICD-10-CM | POA: Diagnosis not present

## 2021-10-16 DIAGNOSIS — E1140 Type 2 diabetes mellitus with diabetic neuropathy, unspecified: Secondary | ICD-10-CM | POA: Diagnosis not present

## 2021-10-18 DIAGNOSIS — J9601 Acute respiratory failure with hypoxia: Secondary | ICD-10-CM | POA: Diagnosis not present

## 2021-10-18 DIAGNOSIS — T86810 Lung transplant rejection: Secondary | ICD-10-CM | POA: Diagnosis not present

## 2021-10-23 DIAGNOSIS — E7800 Pure hypercholesterolemia, unspecified: Secondary | ICD-10-CM | POA: Diagnosis not present

## 2021-10-23 DIAGNOSIS — D649 Anemia, unspecified: Secondary | ICD-10-CM | POA: Diagnosis not present

## 2021-10-23 DIAGNOSIS — R634 Abnormal weight loss: Secondary | ICD-10-CM | POA: Diagnosis not present

## 2021-10-23 DIAGNOSIS — N1831 Chronic kidney disease, stage 3a: Secondary | ICD-10-CM | POA: Diagnosis not present

## 2021-10-23 DIAGNOSIS — Z942 Lung transplant status: Secondary | ICD-10-CM | POA: Diagnosis not present

## 2021-10-23 DIAGNOSIS — E1140 Type 2 diabetes mellitus with diabetic neuropathy, unspecified: Secondary | ICD-10-CM | POA: Diagnosis not present

## 2021-10-23 DIAGNOSIS — Z79891 Long term (current) use of opiate analgesic: Secondary | ICD-10-CM | POA: Diagnosis not present

## 2021-10-23 DIAGNOSIS — R5382 Chronic fatigue, unspecified: Secondary | ICD-10-CM | POA: Diagnosis not present

## 2021-10-23 DIAGNOSIS — I10 Essential (primary) hypertension: Secondary | ICD-10-CM | POA: Diagnosis not present

## 2021-10-25 DIAGNOSIS — E875 Hyperkalemia: Secondary | ICD-10-CM | POA: Diagnosis not present

## 2021-10-25 DIAGNOSIS — D84821 Immunodeficiency due to drugs: Secondary | ICD-10-CM | POA: Diagnosis not present

## 2021-10-25 DIAGNOSIS — N1831 Chronic kidney disease, stage 3a: Secondary | ICD-10-CM | POA: Diagnosis not present

## 2021-10-25 DIAGNOSIS — Z5181 Encounter for therapeutic drug level monitoring: Secondary | ICD-10-CM | POA: Diagnosis not present

## 2021-10-25 DIAGNOSIS — I10 Essential (primary) hypertension: Secondary | ICD-10-CM | POA: Diagnosis not present

## 2021-10-25 DIAGNOSIS — T86818 Other complications of lung transplant: Secondary | ICD-10-CM | POA: Diagnosis not present

## 2021-10-25 DIAGNOSIS — Z79899 Other long term (current) drug therapy: Secondary | ICD-10-CM | POA: Diagnosis not present

## 2021-10-25 DIAGNOSIS — Z9981 Dependence on supplemental oxygen: Secondary | ICD-10-CM | POA: Diagnosis not present

## 2021-10-25 DIAGNOSIS — R634 Abnormal weight loss: Secondary | ICD-10-CM | POA: Diagnosis not present

## 2021-10-25 DIAGNOSIS — Z942 Lung transplant status: Secondary | ICD-10-CM | POA: Diagnosis not present

## 2021-10-25 DIAGNOSIS — D849 Immunodeficiency, unspecified: Secondary | ICD-10-CM | POA: Diagnosis not present

## 2021-10-25 DIAGNOSIS — E785 Hyperlipidemia, unspecified: Secondary | ICD-10-CM | POA: Diagnosis not present

## 2021-10-25 DIAGNOSIS — T86810 Lung transplant rejection: Secondary | ICD-10-CM | POA: Diagnosis not present

## 2021-11-18 DIAGNOSIS — J9601 Acute respiratory failure with hypoxia: Secondary | ICD-10-CM | POA: Diagnosis not present

## 2021-11-18 DIAGNOSIS — T86810 Lung transplant rejection: Secondary | ICD-10-CM | POA: Diagnosis not present

## 2021-12-18 DIAGNOSIS — T86810 Lung transplant rejection: Secondary | ICD-10-CM | POA: Diagnosis not present

## 2021-12-18 DIAGNOSIS — J9601 Acute respiratory failure with hypoxia: Secondary | ICD-10-CM | POA: Diagnosis not present

## 2021-12-30 DIAGNOSIS — E7800 Pure hypercholesterolemia, unspecified: Secondary | ICD-10-CM | POA: Diagnosis not present

## 2021-12-30 DIAGNOSIS — I10 Essential (primary) hypertension: Secondary | ICD-10-CM | POA: Diagnosis not present

## 2021-12-30 DIAGNOSIS — Z79891 Long term (current) use of opiate analgesic: Secondary | ICD-10-CM | POA: Diagnosis not present

## 2021-12-30 DIAGNOSIS — R5382 Chronic fatigue, unspecified: Secondary | ICD-10-CM | POA: Diagnosis not present

## 2021-12-30 DIAGNOSIS — E1140 Type 2 diabetes mellitus with diabetic neuropathy, unspecified: Secondary | ICD-10-CM | POA: Diagnosis not present

## 2021-12-30 DIAGNOSIS — N1831 Chronic kidney disease, stage 3a: Secondary | ICD-10-CM | POA: Diagnosis not present

## 2021-12-30 DIAGNOSIS — R634 Abnormal weight loss: Secondary | ICD-10-CM | POA: Diagnosis not present

## 2022-01-15 DIAGNOSIS — D849 Immunodeficiency, unspecified: Secondary | ICD-10-CM | POA: Diagnosis not present

## 2022-01-15 DIAGNOSIS — T86818 Other complications of lung transplant: Secondary | ICD-10-CM | POA: Diagnosis not present

## 2022-01-15 DIAGNOSIS — T86810 Lung transplant rejection: Secondary | ICD-10-CM | POA: Diagnosis not present

## 2022-01-15 DIAGNOSIS — Z942 Lung transplant status: Secondary | ICD-10-CM | POA: Diagnosis not present

## 2022-01-15 DIAGNOSIS — N1832 Chronic kidney disease, stage 3b: Secondary | ICD-10-CM | POA: Diagnosis not present

## 2022-01-15 DIAGNOSIS — I129 Hypertensive chronic kidney disease with stage 1 through stage 4 chronic kidney disease, or unspecified chronic kidney disease: Secondary | ICD-10-CM | POA: Diagnosis not present

## 2022-01-15 DIAGNOSIS — M4854XA Collapsed vertebra, not elsewhere classified, thoracic region, initial encounter for fracture: Secondary | ICD-10-CM | POA: Diagnosis not present

## 2022-01-15 DIAGNOSIS — Z79899 Other long term (current) drug therapy: Secondary | ICD-10-CM | POA: Diagnosis not present

## 2022-01-15 DIAGNOSIS — J84112 Idiopathic pulmonary fibrosis: Secondary | ICD-10-CM | POA: Diagnosis not present

## 2022-01-15 DIAGNOSIS — E875 Hyperkalemia: Secondary | ICD-10-CM | POA: Diagnosis not present

## 2022-01-15 DIAGNOSIS — I10 Essential (primary) hypertension: Secondary | ICD-10-CM | POA: Diagnosis not present

## 2022-01-15 DIAGNOSIS — R634 Abnormal weight loss: Secondary | ICD-10-CM | POA: Diagnosis not present

## 2022-01-15 DIAGNOSIS — Z5181 Encounter for therapeutic drug level monitoring: Secondary | ICD-10-CM | POA: Diagnosis not present

## 2022-01-15 DIAGNOSIS — E785 Hyperlipidemia, unspecified: Secondary | ICD-10-CM | POA: Diagnosis not present

## 2022-01-18 DIAGNOSIS — T86810 Lung transplant rejection: Secondary | ICD-10-CM | POA: Diagnosis not present

## 2022-01-18 DIAGNOSIS — J9601 Acute respiratory failure with hypoxia: Secondary | ICD-10-CM | POA: Diagnosis not present

## 2022-01-22 DIAGNOSIS — Z942 Lung transplant status: Secondary | ICD-10-CM | POA: Diagnosis not present

## 2022-01-22 DIAGNOSIS — D649 Anemia, unspecified: Secondary | ICD-10-CM | POA: Diagnosis not present

## 2022-01-22 DIAGNOSIS — R5382 Chronic fatigue, unspecified: Secondary | ICD-10-CM | POA: Diagnosis not present

## 2022-01-22 DIAGNOSIS — Z79891 Long term (current) use of opiate analgesic: Secondary | ICD-10-CM | POA: Diagnosis not present

## 2022-01-22 DIAGNOSIS — Z23 Encounter for immunization: Secondary | ICD-10-CM | POA: Diagnosis not present

## 2022-01-22 DIAGNOSIS — I10 Essential (primary) hypertension: Secondary | ICD-10-CM | POA: Diagnosis not present

## 2022-01-22 DIAGNOSIS — N1831 Chronic kidney disease, stage 3a: Secondary | ICD-10-CM | POA: Diagnosis not present

## 2022-01-22 DIAGNOSIS — E1140 Type 2 diabetes mellitus with diabetic neuropathy, unspecified: Secondary | ICD-10-CM | POA: Diagnosis not present

## 2022-01-22 DIAGNOSIS — R634 Abnormal weight loss: Secondary | ICD-10-CM | POA: Diagnosis not present

## 2022-01-22 DIAGNOSIS — E7800 Pure hypercholesterolemia, unspecified: Secondary | ICD-10-CM | POA: Diagnosis not present

## 2022-02-17 DIAGNOSIS — T86810 Lung transplant rejection: Secondary | ICD-10-CM | POA: Diagnosis not present

## 2022-02-17 DIAGNOSIS — J9601 Acute respiratory failure with hypoxia: Secondary | ICD-10-CM | POA: Diagnosis not present

## 2022-02-25 IMAGING — XA IR KYPHO VERTEBRAL THORACIC AUGMENTATION
11 of 12 series · 13 of 22 positions shown · non-contrast
Comparison: MRI of the thoracic spine April 11, 2021.

INDICATION: Severe thoracolumbar pain secondary to compression fracture at T11.

EXAM:
BALLOON KYPHOPLASTY AT T11

[Series 2: single · 1 of 2 slices shown (1 of 11)]
[im 1/2]
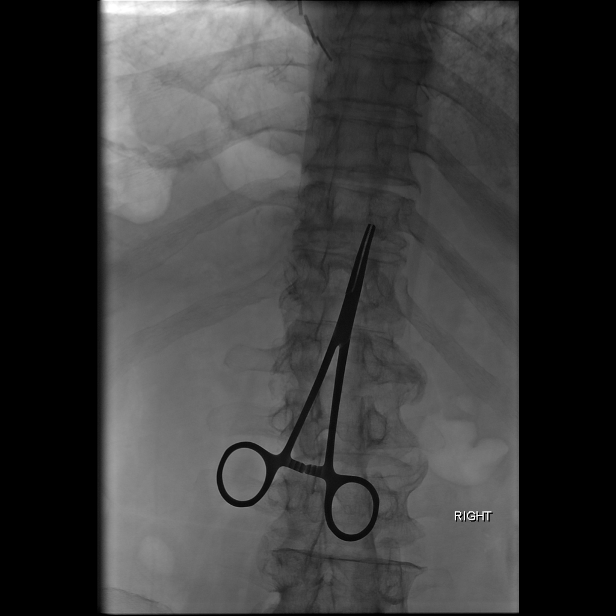

[Series 3: single · 1 of 2 slices shown (2 of 11)]
[im 1/2]
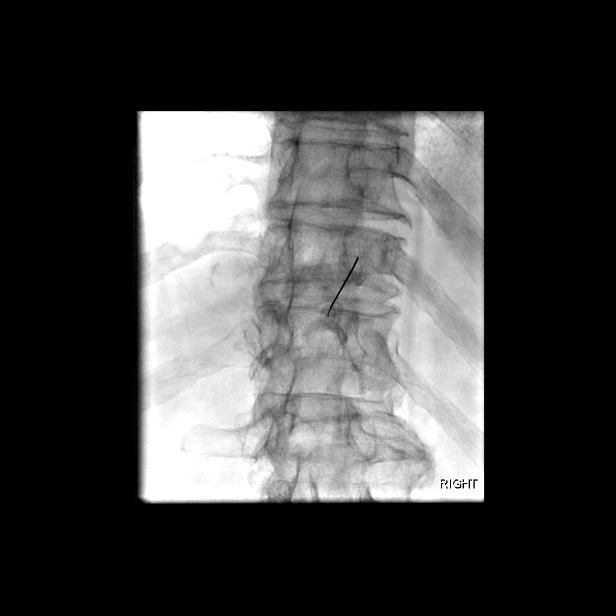

[Series 4: single · 2 of 2 slices shown (3 of 11)]
[im 1/2]
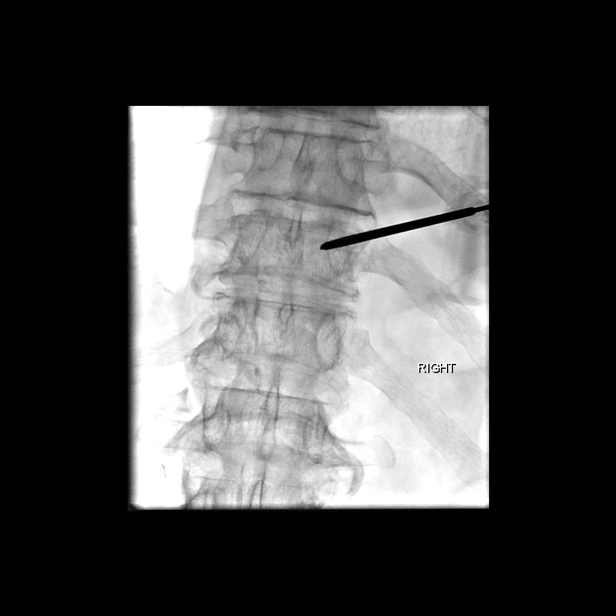
[im 2/2]
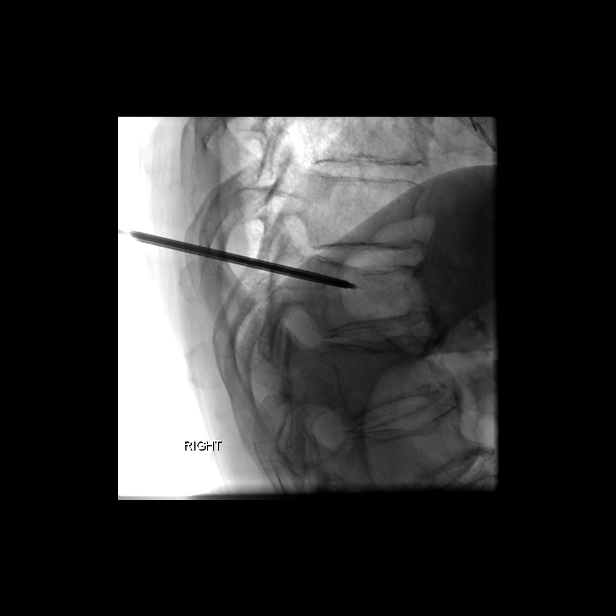

[Series 5: single · 1 of 2 slices shown (4 of 11)]
[im 2/2]
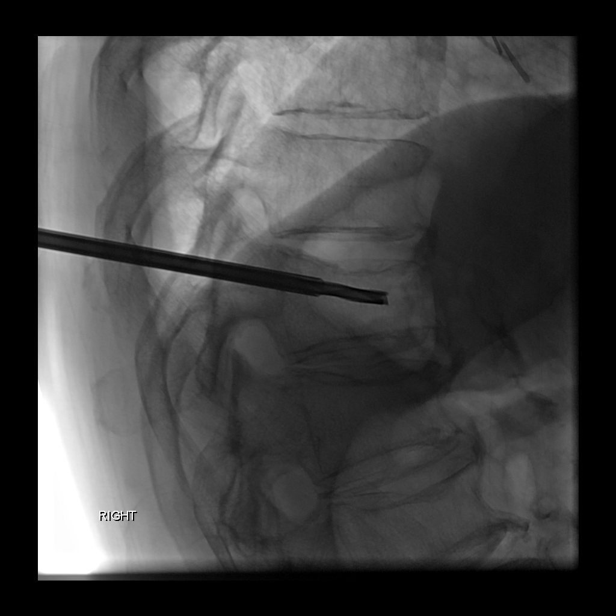

[Series 6: single · 1 of 2 slices shown (5 of 11)]
[im 2/2]
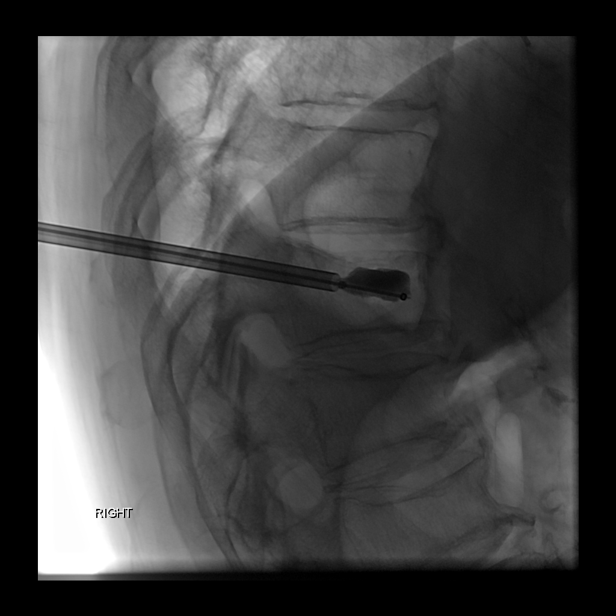

[Series 7: single · 1 of 2 slices shown (6 of 11)]
[im 2/2]
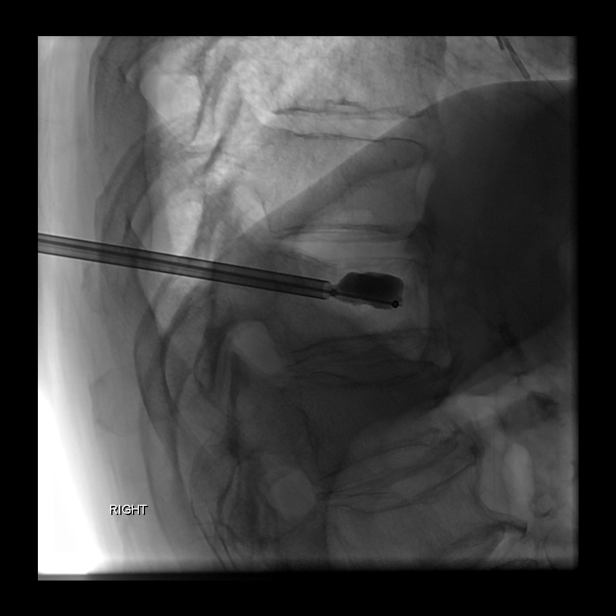

[Series 8: single · 1 of 2 slices shown (7 of 11)]
[im 1/2]
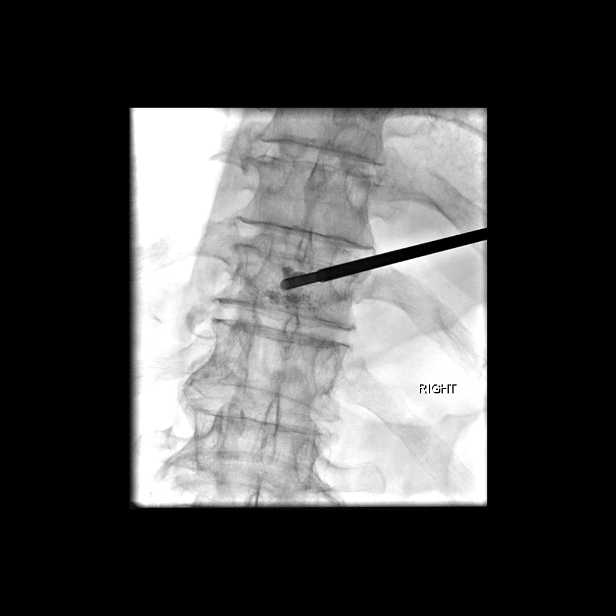

[Series 9: single · 1 of 2 slices shown (8 of 11)]
[im 1/2]
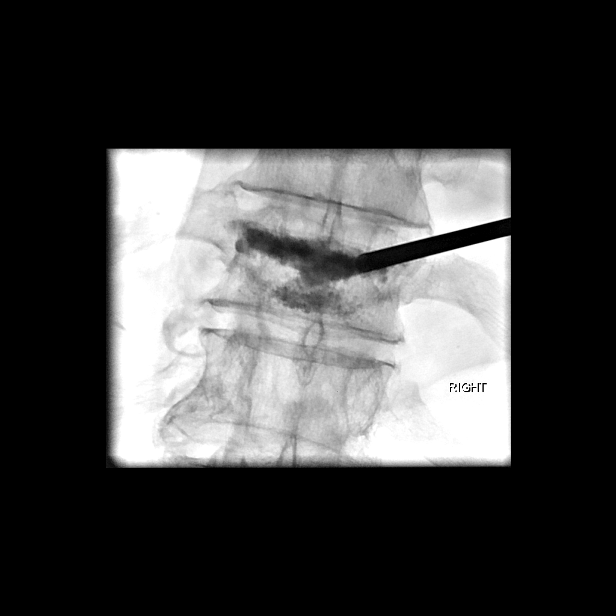

[Series 10: single · 2 of 2 slices shown (9 of 11)]
[im 1/2]
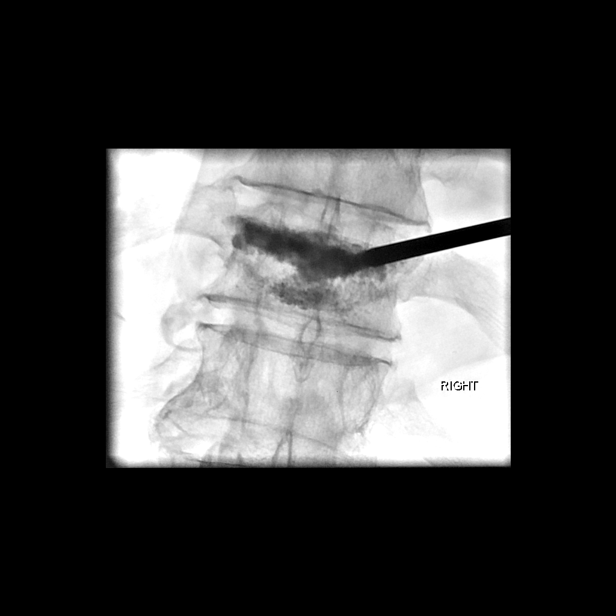
[im 2/2]
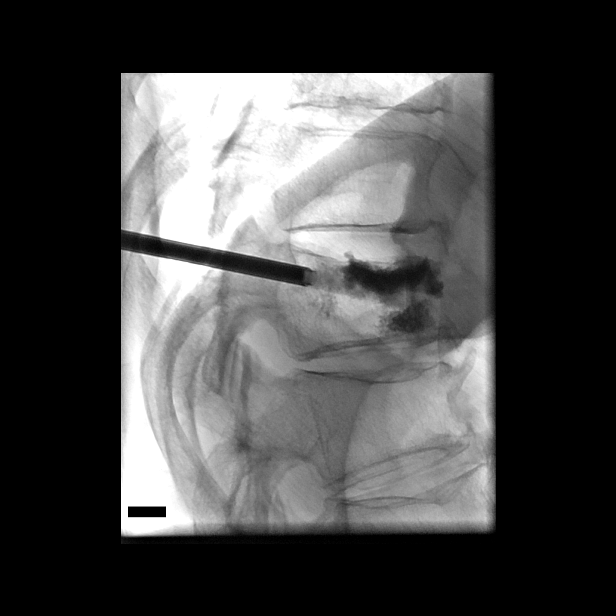

[Series 11: single · 1 of 2 slices shown (10 of 11)]
[im 2/2]
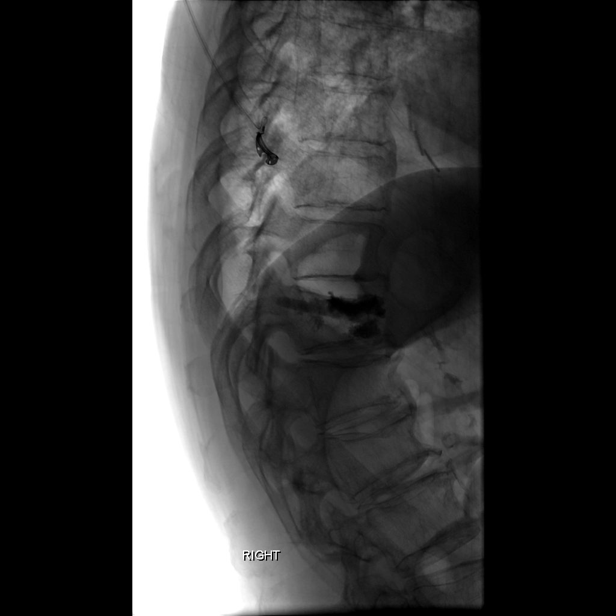

[Series 13: single · 1 of 1 slices shown (11 of 11)]
[im 1/1]
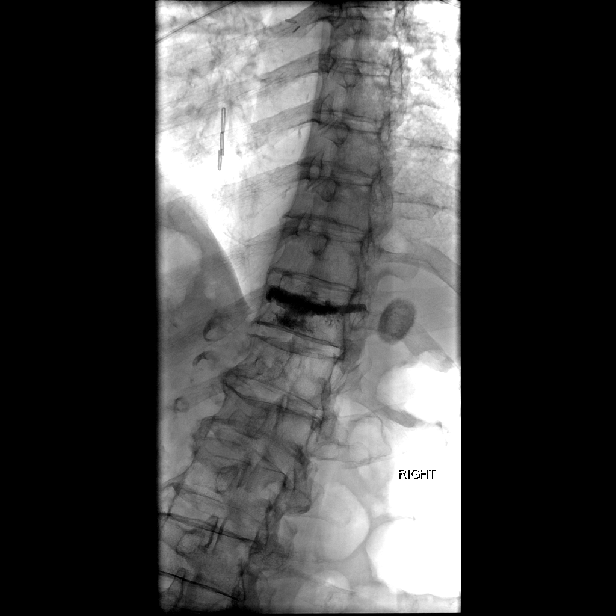

[13 of 22 positions shown; findings below may reference images not displayed]

MEDICATIONS:
As antibiotic prophylaxis, Ancef 2 g IV was ordered pre-procedure
and administered intravenously within 1 hour of incision.

ANESTHESIA/SEDATION:
Moderate (conscious) sedation was employed during this procedure. A
total of Versed 2.5 mg and Fentanyl 125 mcg was administered
intravenously by the radiology nurse.

Total intra-service moderate Sedation Time: 24 minutes. The
patient's level of consciousness and vital signs were monitored
continuously by radiology nursing throughout the procedure under my
direct supervision.

FLUOROSCOPY:
Radiation Exposure Index (as provided by the fluoroscopic device):
547 mGy Kerma.

COMPLICATIONS:
None immediate.

PROCEDURE:
Following a full explanation of the procedure along with the
potential associated complications, an informed witnessed consent
was obtained.

The patient was placed prone on the fluoroscopic table. The skin
overlying the thoracolumbar region was then prepped and draped in
the usual sterile fashion. The right pedicle at T11 was then
infiltrated with 0.25% bupivacaine followed by the advancement of an
11-gauge Jamshidi needle through the right pedicle into the
posterior one-third at T11. This was then exchanged for a Kyphon
advanced osteo introducer system comprised of a working cannula and
a Kyphon osteo drill.

This combination was then advanced over a Kyphon osteo bone pin
until the tip of the Kyphon osteo drill was in the posterior
one-third at T11.

At this time, the bone pin was removed. In a medial trajectory, the
combination was advanced until the tip of the working cannula was
inside the posterior one-third at T11.

The osteo drill was removed.

Through the working cannula, a Kyphon inflatable bone tamp 20 x 3
was advanced and positioned with the distal marker 5 mm from the
anterior aspect of T11. Crossing of the midline was seen on the AP
projection. At this time, the balloon was expanded using contrast
via a Kyphon inflation syringe device via microtubing.

Inflations were continued until there was apposition with the
superior endplate.

At this time, methylmethacrylate mixture was reconstituted with
Tobramycin in the Kyphon bone mixing device system. This was then
loaded onto the Kyphon bone fillers.

The balloon was deflated and removed followed by the instillation of
4 bone filler equivalents of methylmethacrylate mixture at T11 with
excellent filling in the AP and lateral projections. No
extravasation was noted in the disk spaces or posteriorly into the
spinal canal. No epidural venous contamination was seen.

The working cannula and the bone filler were then retrieved and
removed.
IMPRESSION: 1. Status post vertebral body augmentation using balloon kyphoplasty
at T11 as described without event.

The patient has suffered a fracture of the vertebral body. It is
recommended that patients aged 50 years or older be evaluated for
possible testing or treatment of osteoporosis. A copy of this
procedure report is sent to the patient's referring physician.

## 2022-03-20 DIAGNOSIS — J9601 Acute respiratory failure with hypoxia: Secondary | ICD-10-CM | POA: Diagnosis not present

## 2022-03-20 DIAGNOSIS — T86810 Lung transplant rejection: Secondary | ICD-10-CM | POA: Diagnosis not present

## 2022-04-20 DIAGNOSIS — T86810 Lung transplant rejection: Secondary | ICD-10-CM | POA: Diagnosis not present

## 2022-04-20 DIAGNOSIS — J9601 Acute respiratory failure with hypoxia: Secondary | ICD-10-CM | POA: Diagnosis not present

## 2022-05-19 DIAGNOSIS — J9601 Acute respiratory failure with hypoxia: Secondary | ICD-10-CM | POA: Diagnosis not present

## 2022-05-19 DIAGNOSIS — T86810 Lung transplant rejection: Secondary | ICD-10-CM | POA: Diagnosis not present

## 2022-07-18 DIAGNOSIS — Z743 Need for continuous supervision: Secondary | ICD-10-CM | POA: Diagnosis not present

## 2022-07-18 DIAGNOSIS — R404 Transient alteration of awareness: Secondary | ICD-10-CM | POA: Diagnosis not present

## 2022-08-09 DEATH — deceased
